# Patient Record
Sex: Female | Born: 1975 | Race: White | Hispanic: No | Smoking: Current every day smoker
Health system: Southern US, Community
[De-identification: ages and names within clinical notes are randomized; demographics above are authoritative.]

## PROBLEM LIST (undated history)

## (undated) DIAGNOSIS — R011 Cardiac murmur, unspecified: Secondary | ICD-10-CM

## (undated) DIAGNOSIS — F329 Major depressive disorder, single episode, unspecified: Secondary | ICD-10-CM

## (undated) DIAGNOSIS — F32A Depression, unspecified: Secondary | ICD-10-CM

## (undated) DIAGNOSIS — M1990 Unspecified osteoarthritis, unspecified site: Secondary | ICD-10-CM

## (undated) DIAGNOSIS — F410 Panic disorder [episodic paroxysmal anxiety] without agoraphobia: Secondary | ICD-10-CM

## (undated) DIAGNOSIS — G932 Benign intracranial hypertension: Secondary | ICD-10-CM

## (undated) HISTORY — PX: OOPHORECTOMY: SHX86

---

## 2005-07-22 ENCOUNTER — Ambulatory Visit: Payer: Self-pay | Admitting: Unknown Physician Specialty

## 2006-11-28 ENCOUNTER — Inpatient Hospital Stay (HOSPITAL_COMMUNITY): Admission: AD | Admit: 2006-11-28 | Discharge: 2006-11-28 | Payer: Self-pay | Admitting: Obstetrics and Gynecology

## 2007-04-13 ENCOUNTER — Ambulatory Visit: Payer: Self-pay | Admitting: Obstetrics & Gynecology

## 2007-04-14 ENCOUNTER — Inpatient Hospital Stay: Payer: Self-pay

## 2010-12-06 LAB — CBC
Hemoglobin: 11.9 — ABNORMAL LOW
MCHC: 35
MCV: 89.1
RDW: 14.1 — ABNORMAL HIGH
WBC: 14.9 — ABNORMAL HIGH

## 2011-08-21 ENCOUNTER — Emergency Department: Payer: Self-pay | Admitting: *Deleted

## 2011-08-31 ENCOUNTER — Emergency Department (HOSPITAL_COMMUNITY): Payer: Medicaid Other

## 2011-08-31 ENCOUNTER — Emergency Department: Payer: Self-pay | Admitting: Emergency Medicine

## 2011-08-31 ENCOUNTER — Encounter (HOSPITAL_COMMUNITY): Payer: Self-pay | Admitting: *Deleted

## 2011-08-31 ENCOUNTER — Emergency Department (HOSPITAL_COMMUNITY)
Admission: EM | Admit: 2011-08-31 | Discharge: 2011-09-01 | Disposition: A | Discharge status: Home / Self Care | Payer: Medicaid Other | Attending: Emergency Medicine | Admitting: Emergency Medicine

## 2011-08-31 DIAGNOSIS — S93409A Sprain of unspecified ligament of unspecified ankle, initial encounter: Secondary | ICD-10-CM | POA: Insufficient documentation

## 2011-08-31 DIAGNOSIS — W108XXA Fall (on) (from) other stairs and steps, initial encounter: Secondary | ICD-10-CM | POA: Insufficient documentation

## 2011-08-31 DIAGNOSIS — F17200 Nicotine dependence, unspecified, uncomplicated: Secondary | ICD-10-CM | POA: Insufficient documentation

## 2011-08-31 DIAGNOSIS — F3289 Other specified depressive episodes: Secondary | ICD-10-CM | POA: Insufficient documentation

## 2011-08-31 DIAGNOSIS — F329 Major depressive disorder, single episode, unspecified: Secondary | ICD-10-CM | POA: Insufficient documentation

## 2011-08-31 DIAGNOSIS — Y92009 Unspecified place in unspecified non-institutional (private) residence as the place of occurrence of the external cause: Secondary | ICD-10-CM | POA: Insufficient documentation

## 2011-08-31 HISTORY — DX: Panic disorder (episodic paroxysmal anxiety): F41.0

## 2011-08-31 HISTORY — DX: Depression, unspecified: F32.A

## 2011-08-31 HISTORY — DX: Major depressive disorder, single episode, unspecified: F32.9

## 2011-08-31 MED ORDER — OXYCODONE-ACETAMINOPHEN 5-325 MG PO TABS: 1.0000 | ORAL_TABLET | Freq: Four times a day (QID) | ORAL | Status: AC | PRN

## 2011-08-31 MED ORDER — OXYCODONE-ACETAMINOPHEN 5-325 MG PO TABS
2.0000 | ORAL_TABLET | Freq: Once | ORAL | Status: AC
  Administered 2011-08-31: 2 via ORAL
  Filled 2011-08-31: qty 2

## 2011-08-31 NOTE — ED Provider Notes (Signed)
History     CSN: [id number]  Arrival date & time 08/31/11  2119   First MD Initiated Contact with Patient 08/31/11 2334      Chief Complaint  Patient presents with  . Ankle Pain  . Foot Pain    (Consider location/radiation/quality/duration/timing/severity/associated sxs/prior treatment) HPI Comments: Patient states she was going down her porch steps, when she slipped, falling down one step, twisting her right ankle, 2 days ago.  She has been using an Ace wrap and taking ibuprofen, without any relief.  She states is still swelling, and discoloration, have improved, but she still having significant, pain with ambulation.  She has been using crutches as well has no previous history of injury to that ankle  Patient is a 36 y.o. female presenting with ankle pain and lower extremity pain. The history is provided by the patient.  Ankle Pain  The incident occurred 2 days ago. The incident occurred at home. The injury mechanism was a fall. The pain is present in the left ankle. Pertinent negatives include no numbness.  Foot Pain Associated symptoms include joint swelling. Pertinent negatives include no chills, numbness or weakness.    Past Medical History  Diagnosis Date  . Depression   . Anxiety attack     History reviewed. No pertinent past surgical history.  History reviewed. No pertinent family history.  History  Substance Use Topics  . Smoking status: Current Everyday Smoker  . Smokeless tobacco: Not on file  . Alcohol Use: Yes    OB History    Grav Para Term Preterm Abortions TAB SAB Ect Mult Living                  Review of Systems  Constitutional: Negative for chills.  Musculoskeletal: Positive for joint swelling. Negative for back pain.  Neurological: Negative for dizziness, weakness and numbness.    Allergies  Review of patient's allergies indicates no known allergies.  Home Medications   Current Outpatient Rx  Name Route Sig Dispense Refill  . FLUOXETINE  HCL 20 MG PO CAPS Oral Take 20 mg by mouth 2 (two) times daily.    . IBUPROFEN 200 MG PO TABS Oral Take 800 mg by mouth every 6 (six) hours as needed. For pain    . LORAZEPAM 1 MG PO TABS Oral Take 1 mg by mouth daily as needed. For anxiety    . OXYCODONE-ACETAMINOPHEN 5-325 MG PO TABS Oral Take 1 tablet by mouth every 6 (six) hours as needed for pain. 20 tablet 0    BP 129/71  Pulse 81  Temp 98.6 F (37 C) (Oral)  Resp 16  SpO2 99%  LMP 08/20/2011  Physical Exam  Constitutional: She appears well-developed and well-nourished.  HENT:  Head: Normocephalic.  Eyes: Pupils are equal, round, and reactive to light.  Neck: Normal range of motion.  Cardiovascular: Normal rate.   Pulmonary/Chest: She is in respiratory distress.  Musculoskeletal: She exhibits edema and tenderness.       Minimal amount of swelling to the right ankle joint.  No swelling in the foot, resolving bruising to the lateral malleolus area.  Full range of motion, strong distal pulses  Skin: Skin is warm. No erythema.    ED Course  Procedures (including critical care time)  Labs Reviewed - No data to display Dg Ankle Complete Right  08/31/2011  *RADIOLOGY REPORT*  Clinical Data: Pain and swelling at the lateral aspect of the right ankle and foot, status post fall.  RIGHT ANKLE -  COMPLETE 3+ VIEW  Comparison: None.  Findings: There is no evidence of fracture or dislocation.  The ankle mortise is incompletely assessed but appears grossly intact; the interosseous space is within normal limits.  No talar tilt or subluxation is seen.  Minimal irregularity at the dorsal aspect of the navicular is likely degenerative in nature.  The joint spaces are preserved.  No significant soft tissue abnormalities are seen.  IMPRESSION: No evidence of fracture or dislocation.  Original Report Authenticated By: Tonia Ghent, M.D.   Dg Foot Complete Right  08/31/2011  *RADIOLOGY REPORT*  Clinical Data: Pain and swelling at the lateral aspect  of the right ankle and right foot, status post fall.  RIGHT FOOT COMPLETE - 3+ VIEW  Comparison: None.  Findings: There is no evidence of fracture or dislocation.  The joint spaces are preserved.  There is no evidence of talar subluxation; the subtalar joint is unremarkable in appearance. Mild irregularity at the dorsal aspect of the navicular is likely degenerative in nature.  No significant soft tissue abnormalities are seen.  IMPRESSION: No evidence of fracture or dislocation.  Original Report Authenticated By: Tonia Ghent, M.D.     1. Ankle sprain       MDM   X-rays have been reviewed.  There is no fracture, dislocation, subluxation.  Patient will be placed in an ASO.  She, states she has crutches at, home.  She'll be given, additional pain control, and rehabilitation exercises, as well as a referral to orthopedic surgery        Arman Filter, NP 08/31/11 1610  Arman Filter, NP 08/31/11 [phone]

## 2011-08-31 NOTE — ED Notes (Signed)
Pt states 2 days ago she stepped off her porch and she twisted her ankle. Pt states blue and swollen for the past two days ibuprofen with some relief but still painful. Pt ambulatory but with pain.

## 2011-09-01 NOTE — ED Notes (Signed)
Pt discharge home with her friend.Pain has improved.Vital signs stable and GCS 15.

## 2011-09-05 NOTE — ED Provider Notes (Signed)
Medical screening examination/treatment/procedure(s) were performed by non-physician practitioner and as supervising physician I was immediately available for consultation/collaboration.  Cyndra Numbers, MD 09/05/11 2350

## 2012-01-15 ENCOUNTER — Emergency Department (HOSPITAL_COMMUNITY): Payer: Medicaid Other

## 2012-01-15 ENCOUNTER — Encounter (HOSPITAL_COMMUNITY): Payer: Self-pay | Admitting: Emergency Medicine

## 2012-01-15 ENCOUNTER — Emergency Department (HOSPITAL_COMMUNITY)
Admission: EM | Admit: 2012-01-15 | Discharge: 2012-01-15 | Disposition: A | Discharge status: Home / Self Care | Payer: Medicaid Other | Attending: Emergency Medicine | Admitting: Emergency Medicine

## 2012-01-15 DIAGNOSIS — F411 Generalized anxiety disorder: Secondary | ICD-10-CM | POA: Insufficient documentation

## 2012-01-15 DIAGNOSIS — Y9389 Activity, other specified: Secondary | ICD-10-CM | POA: Insufficient documentation

## 2012-01-15 DIAGNOSIS — F17200 Nicotine dependence, unspecified, uncomplicated: Secondary | ICD-10-CM | POA: Insufficient documentation

## 2012-01-15 DIAGNOSIS — Z79899 Other long term (current) drug therapy: Secondary | ICD-10-CM | POA: Insufficient documentation

## 2012-01-15 DIAGNOSIS — S93409A Sprain of unspecified ligament of unspecified ankle, initial encounter: Secondary | ICD-10-CM | POA: Insufficient documentation

## 2012-01-15 DIAGNOSIS — F3289 Other specified depressive episodes: Secondary | ICD-10-CM | POA: Insufficient documentation

## 2012-01-15 DIAGNOSIS — F329 Major depressive disorder, single episode, unspecified: Secondary | ICD-10-CM | POA: Insufficient documentation

## 2012-01-15 DIAGNOSIS — X500XXA Overexertion from strenuous movement or load, initial encounter: Secondary | ICD-10-CM | POA: Insufficient documentation

## 2012-01-15 DIAGNOSIS — Y9289 Other specified places as the place of occurrence of the external cause: Secondary | ICD-10-CM | POA: Insufficient documentation

## 2012-01-15 MED ORDER — OXYCODONE-ACETAMINOPHEN 5-325 MG PO TABS: 2.0000 | ORAL_TABLET | ORAL | Status: DC | PRN

## 2012-01-15 MED ORDER — OXYCODONE-ACETAMINOPHEN 5-325 MG PO TABS
1.0000 | ORAL_TABLET | Freq: Once | ORAL | Status: AC
  Administered 2012-01-15: 1 via ORAL
  Filled 2012-01-15: qty 1

## 2012-01-15 NOTE — ED Notes (Signed)
Pt c/o left ankle pain x 5 days after twisting ankle

## 2012-01-15 NOTE — ED Notes (Signed)
Pt states has been up all night with left ankle pain, no deformity noted, no swelling noted although pt states swollen, states was more swollen and bruised 5 days

## 2012-01-15 NOTE — ED Notes (Signed)
Pt returned from X-ray. Pt stable

## 2012-01-15 NOTE — ED Provider Notes (Signed)
History    This chart was scribed for Cheri Guppy, MD, MD by Smitty Pluck, ED Scribe. The patient was seen in room TR11C and the patient's care was started at 1:20PM.   CSN: [id number]  Arrival date & time 01/15/12  1253      Chief Complaint  Patient presents with  . Ankle Pain    (Consider location/radiation/quality/duration/timing/severity/associated sxs/prior treatment) Patient is a 36 y.o. female presenting with ankle pain. The history is provided by the patient. No language interpreter was used.  Ankle Pain    Michele Mann is a 36 y.o. female who presents to the Emergency Department complaining of constant, moderate left ankle pain onset 5 days ago. Pt reports that she twisted her left ankle while getting out of bed at night. She denies any other pain.      Past Medical History  Diagnosis Date  . Depression   . Anxiety attack     History reviewed. No pertinent past surgical history.  History reviewed. No pertinent family history.  History  Substance Use Topics  . Smoking status: Current Every Day Smoker  . Smokeless tobacco: Not on file  . Alcohol Use: Yes    OB History    Grav Para Term Preterm Abortions TAB SAB Ect Mult Living                  Review of Systems  Constitutional: Negative for fever and chills.  Respiratory: Negative for shortness of breath.   Gastrointestinal: Negative for nausea and vomiting.  Neurological: Negative for weakness.  All other systems reviewed and are negative.    Allergies  Review of patient's allergies indicates no known allergies.  Home Medications   Current Outpatient Rx  Name  Route  Sig  Dispense  Refill  . FLUOXETINE HCL 20 MG PO CAPS   Oral   Take 20 mg by mouth 2 (two) times daily.         . IBUPROFEN 200 MG PO TABS   Oral   Take 800 mg by mouth every 6 (six) hours as needed. For pain         . LORAZEPAM 1 MG PO TABS   Oral   Take 1 mg by mouth daily as needed. For anxiety             BP 116/69  Pulse 85  Temp 98 F (36.7 C) (Oral)  Resp 16  SpO2 98%  Physical Exam  Nursing note and vitals reviewed. Constitutional: She is oriented to person, place, and time. She appears well-developed and well-nourished. No distress.  HENT:  Head: Normocephalic and atraumatic.  Eyes: EOM are normal.  Neck: Neck supple. No tracheal deviation present.  Cardiovascular: Normal rate.   Pulmonary/Chest: Effort normal. No respiratory distress.  Musculoskeletal:       No left achilles tenderness No left medial malleolus  Tenderness of base of 5th metarsal of left foot Lateral mallelolus tenderness   No navicular tenderness  No swelling of left ankle No deformity of left foot or left ankle No color change to skin   Neurological: She is alert and oriented to person, place, and time.  Skin: Skin is warm and dry.  Psychiatric: She has a normal mood and affect. Her behavior is normal.    ED Course  Procedures (including critical care time) DIAGNOSTIC STUDIES: Oxygen Saturation is 98% on room air, normal by my interpretation.    COORDINATION OF CARE: 1:24 PM Discussed ED treatment with pt  1:25 PM Ordered:    . oxyCODONE-acetaminophen  1 tablet Oral Once       Labs Reviewed - No data to display Dg Ankle Complete Left  01/15/2012  *RADIOLOGY REPORT*  Clinical Data: Inversion injury.  LEFT ANKLE COMPLETE - 3+ VIEW  Comparison: None  Findings: Normal alignment and no fracture.  No joint effusion or arthropathy.  IMPRESSION: Negative   Original Report Authenticated By: Janeece Riggers, M.D.      No diagnosis found.    MDM  Ankle sprain. No fx or dislocation. Pt has crutches at  Home.      I personally performed the services described in this documentation, which was scribed in my presence. The recorded information has been reviewed and is accurate.    Cheri Guppy, MD 01/15/12 1401

## 2012-03-02 ENCOUNTER — Ambulatory Visit: Payer: Self-pay

## 2012-03-02 LAB — HEMATOCRIT: HCT: 36.6 % (ref 35.0–47.0)

## 2012-03-13 ENCOUNTER — Ambulatory Visit: Payer: Self-pay

## 2012-03-13 HISTORY — PX: ABDOMINAL HYSTERECTOMY: SHX81

## 2012-03-17 LAB — PATHOLOGY REPORT

## 2012-05-10 ENCOUNTER — Emergency Department (HOSPITAL_COMMUNITY): Payer: Medicaid Other

## 2012-05-10 ENCOUNTER — Encounter (HOSPITAL_COMMUNITY): Payer: Self-pay

## 2012-05-10 ENCOUNTER — Emergency Department (HOSPITAL_COMMUNITY)
Admission: EM | Admit: 2012-05-10 | Discharge: 2012-05-11 | Disposition: A | Discharge status: Home / Self Care | Payer: Medicaid Other | Attending: Emergency Medicine | Admitting: Emergency Medicine

## 2012-05-10 DIAGNOSIS — F3289 Other specified depressive episodes: Secondary | ICD-10-CM | POA: Insufficient documentation

## 2012-05-10 DIAGNOSIS — Z79899 Other long term (current) drug therapy: Secondary | ICD-10-CM | POA: Insufficient documentation

## 2012-05-10 DIAGNOSIS — F411 Generalized anxiety disorder: Secondary | ICD-10-CM | POA: Insufficient documentation

## 2012-05-10 DIAGNOSIS — S93402A Sprain of unspecified ligament of left ankle, initial encounter: Secondary | ICD-10-CM

## 2012-05-10 DIAGNOSIS — F17200 Nicotine dependence, unspecified, uncomplicated: Secondary | ICD-10-CM | POA: Insufficient documentation

## 2012-05-10 DIAGNOSIS — Y92009 Unspecified place in unspecified non-institutional (private) residence as the place of occurrence of the external cause: Secondary | ICD-10-CM | POA: Insufficient documentation

## 2012-05-10 DIAGNOSIS — F329 Major depressive disorder, single episode, unspecified: Secondary | ICD-10-CM | POA: Insufficient documentation

## 2012-05-10 DIAGNOSIS — S93409A Sprain of unspecified ligament of unspecified ankle, initial encounter: Secondary | ICD-10-CM | POA: Insufficient documentation

## 2012-05-10 DIAGNOSIS — X500XXA Overexertion from strenuous movement or load, initial encounter: Secondary | ICD-10-CM | POA: Insufficient documentation

## 2012-05-10 DIAGNOSIS — Y9301 Activity, walking, marching and hiking: Secondary | ICD-10-CM | POA: Insufficient documentation

## 2012-05-10 MED ORDER — HYDROCODONE-ACETAMINOPHEN 5-325 MG PO TABS: 1.0000 | ORAL_TABLET | Freq: Once | ORAL | Status: DC

## 2012-05-10 NOTE — Progress Notes (Signed)
Orthopedic Tech Progress Note Patient Details:  Michele Mann August 14, 1975 [id number]  Ortho Devices Type of Ortho Device: ASO   Haskell Flirt 05/10/2012, 11:47 PM

## 2012-05-10 NOTE — ED Notes (Signed)
Ortho called for brace 

## 2012-05-10 NOTE — ED Provider Notes (Signed)
History     CSN: [id number]  Arrival date & time 05/10/12  2209   First MD Initiated Contact with Patient 05/10/12 2250      Chief Complaint  Patient presents with  . Ankle Pain    (Consider location/radiation/quality/duration/timing/severity/associated sxs/prior treatment) HPI History provided by pt.   Pt states that she was walking across her yard to get to her car this evening, stepped in a "dip", and twisted her L ankle.  She fell, landing on her buttocks; did not hit her head.  C/o pain left lateral ankle only.  Aggravated by bearing weight but she is ambulatory.  Associated w/ paresthesias of 4th and 5th toes.  Has taken ibuprofen and tylenol for pain w/out relief.  Past Medical History  Diagnosis Date  . Depression   . Anxiety attack     Past Surgical History  Procedure Laterality Date  . Cesarean section      x2  . Abdominal hysterectomy  03/13/2012    No family history on file.  History  Substance Use Topics  . Smoking status: Current Every Day Smoker -- 1.00 packs/day    Types: Cigarettes  . Smokeless tobacco: Never Used  . Alcohol Use: Yes    OB History   Grav Para Term Preterm Abortions TAB SAB Ect Mult Living                  Review of Systems  All other systems reviewed and are negative.    Allergies  Review of patient's allergies indicates no known allergies.  Home Medications   Current Outpatient Rx  Name  Route  Sig  Dispense  Refill  . acetaminophen (TYLENOL) 500 MG tablet   Oral   Take 1,000 mg by mouth 3 (three) times daily as needed for pain.         Marland Kitchen ALPRAZolam (XANAX XR) 2 MG 24 hr tablet   Oral   Take 2 mg by mouth 3 (three) times daily as needed (for anxiety and panic disorder).         . diazepam (VALIUM) 5 MG tablet   Oral   Take 5 mg by mouth 3 (three) times daily as needed (for tendonitis in neck). 10 day trial started 05/07/2012         . fluvoxaMINE (LUVOX) 100 MG tablet   Oral   Take 100 mg by mouth at  bedtime.         Marland Kitchen ibuprofen (ADVIL,MOTRIN) 200 MG tablet   Oral   Take 600 mg by mouth once.           BP 109/65  Pulse 90  Temp(Src) 0 F (-17.8 C)  Resp 16  SpO2 100%  LMP 01/12/2012  Physical Exam  Nursing note and vitals reviewed. Constitutional: She is oriented to person, place, and time. She appears well-developed and well-nourished. No distress.  HENT:  Head: Normocephalic and atraumatic.  Eyes:  Normal appearance  Neck: Normal range of motion.  Pulmonary/Chest: Effort normal.  Musculoskeletal: Normal range of motion.  Left ankle w/out deformity or edema.  Mild ecchymosis on dorsal surface of lateral tarsals.  Tenderness at and inferior to lateral malleolus.  Pain w/ passive ROM.  Nml knee.  2+ DP pulse and distal sensation intact.    Neurological: She is alert and oriented to person, place, and time.  Psychiatric: She has a normal mood and affect. Her behavior is normal.    ED Course  Procedures (including critical care time)  Labs Reviewed - No data to display Dg Ankle Complete Left  05/10/2012  *RADIOLOGY REPORT*  Clinical Data: Diffuse left ankle pain after fall with twisting injury.  LEFT ANKLE COMPLETE - 3+ VIEW  Comparison: 01/15/2012  Findings: No evidence of acute fracture or subluxation.  Ankle mortis and talar dome appear intact.  No focal bone lesion or bone destruction.  There is evidence of degenerative change in the subtalar joints with prominent posterior process versus osteophyte of the talus.  Appearance is similar to previous study.  IMPRESSION: Degenerative changes in the left ankle.  No acute fracture demonstrated.   Original Report Authenticated By: Burman Nieves, M.D.      1. Sprain of left ankle, initial encounter       MDM  [phone] F presents w/ mechanical fall and inversion injury of left ankle.  Ambulatory.  No deformity and NV intact on exam.  Xray neg for fx/dislocation.  Suspect malingering.  Pt has been requesting pain medication  since arrival and she becomes argumentative when I deny her a prescription for narcotic.  She is here with a friend who also has a musculoskeletal complaint secondary to fall.  I recommended NSAID, rest and ice and ortho tech provided her with ASO.  She has crutches at home.  Had single dose of vicodin in ED.  Referred to ortho for persistent sx.          Otilio Miu, PA-C 05/10/12 2345

## 2012-05-10 NOTE — ED Notes (Signed)
Patient presents with left ankle pain. Reports that she has a "dip" in her yard [place] that is unleveled] that caused her to twist her ankle. Minimal swelling noted to left ankle area. Patient able to ambulate on it but with pain.

## 2012-05-11 NOTE — ED Provider Notes (Signed)
Medical screening examination/treatment/procedure(s) were performed by non-physician practitioner and as supervising physician I was immediately available for consultation/collaboration.  Hurman Horn, MD 05/11/12 [phone]

## 2012-07-06 ENCOUNTER — Emergency Department: Payer: Self-pay | Admitting: Emergency Medicine

## 2012-08-31 ENCOUNTER — Emergency Department (HOSPITAL_COMMUNITY): Payer: Medicaid Other

## 2012-08-31 ENCOUNTER — Emergency Department (HOSPITAL_COMMUNITY)
Admission: EM | Admit: 2012-08-31 | Discharge: 2012-08-31 | Disposition: A | Discharge status: Home / Self Care | Payer: Medicaid Other | Attending: Emergency Medicine | Admitting: Emergency Medicine

## 2012-08-31 ENCOUNTER — Encounter (HOSPITAL_COMMUNITY): Payer: Self-pay | Admitting: Adult Health

## 2012-08-31 DIAGNOSIS — Y939 Activity, unspecified: Secondary | ICD-10-CM | POA: Insufficient documentation

## 2012-08-31 DIAGNOSIS — R296 Repeated falls: Secondary | ICD-10-CM | POA: Insufficient documentation

## 2012-08-31 DIAGNOSIS — F17200 Nicotine dependence, unspecified, uncomplicated: Secondary | ICD-10-CM | POA: Insufficient documentation

## 2012-08-31 DIAGNOSIS — S93402A Sprain of unspecified ligament of left ankle, initial encounter: Secondary | ICD-10-CM

## 2012-08-31 DIAGNOSIS — F411 Generalized anxiety disorder: Secondary | ICD-10-CM | POA: Insufficient documentation

## 2012-08-31 DIAGNOSIS — Z79899 Other long term (current) drug therapy: Secondary | ICD-10-CM | POA: Insufficient documentation

## 2012-08-31 DIAGNOSIS — Y929 Unspecified place or not applicable: Secondary | ICD-10-CM | POA: Insufficient documentation

## 2012-08-31 DIAGNOSIS — F3289 Other specified depressive episodes: Secondary | ICD-10-CM | POA: Insufficient documentation

## 2012-08-31 DIAGNOSIS — S8990XA Unspecified injury of unspecified lower leg, initial encounter: Secondary | ICD-10-CM | POA: Insufficient documentation

## 2012-08-31 DIAGNOSIS — S93409A Sprain of unspecified ligament of unspecified ankle, initial encounter: Secondary | ICD-10-CM | POA: Insufficient documentation

## 2012-08-31 DIAGNOSIS — F329 Major depressive disorder, single episode, unspecified: Secondary | ICD-10-CM | POA: Insufficient documentation

## 2012-08-31 DIAGNOSIS — M25562 Pain in left knee: Secondary | ICD-10-CM

## 2012-08-31 DIAGNOSIS — Z791 Long term (current) use of non-steroidal anti-inflammatories (NSAID): Secondary | ICD-10-CM | POA: Insufficient documentation

## 2012-08-31 MED ORDER — IBUPROFEN 800 MG PO TABS: 800.0000 mg | ORAL_TABLET | Freq: Three times a day (TID) | ORAL | Status: AC

## 2012-08-31 MED ORDER — HYDROCODONE-ACETAMINOPHEN 5-325 MG PO TABS: 1.0000 | ORAL_TABLET | ORAL | Status: AC | PRN

## 2012-08-31 MED ORDER — IBUPROFEN 200 MG PO TABS
400.0000 mg | ORAL_TABLET | Freq: Once | ORAL | Status: AC
  Administered 2012-08-31: 400 mg via ORAL
  Filled 2012-08-31: qty 2

## 2012-08-31 NOTE — Progress Notes (Signed)
Orthopedic Tech Progress Note Patient Details:  Michele Mann 1975/07/04 [id number]  Ortho Devices Type of Ortho Device: Knee Sleeve;ASO Ortho Device/Splint Location: LLE Ortho Device/Splint Interventions: Ordered;Application   Jennye Moccasin 08/31/2012, 8:53 PM

## 2012-08-31 NOTE — ED Notes (Addendum)
Pt reports left knee injury in may and fall last night that twisted her ankle and landed on her left knee. She reports knee pain and left ankle pain. She is requesting an MRI or CT of her left knee.  Reports her left knee has been ongoing and hurting since the end of May

## 2012-08-31 NOTE — ED Notes (Signed)
Pt denies any questions upon discharge. 

## 2012-08-31 NOTE — ED Provider Notes (Signed)
History    This chart was scribed for Michele Munch, MD by Quintella Reichert, ED scribe.  This patient was seen in room TR05C/TR05C and the patient's care was started at 8:05 PM.  CSN: [id number]  Arrival date & time 08/31/12  1653     Chief Complaint  Patient presents with  . Knee Pain  . Ankle Pain    The history is provided by the patient. No language interpreter was used.    HPI Comments: Michele Mann is a 37 y.o. female who presents to the Emergency Department complaining of left knee and ankle pain.  Pt reports that she injured her knee 6 weeks ago and last night fell and twisted her ankle and landed on the knee again.  Her knee pain is the same in quality as it has been for the past 6 weeks but is more severe subsequent to the fall.  She has also experienced pain to the left ankle since the fall.  She notes that she can feel a knot to her knee and knee pain is exacerbated by lightly touching the area.  She describes ankle pain as burning and exacerbated by bearing weight.  Pt is ambulatory but with pain.  She denies chronic physical medical conditions and medicates regularly with Prozac and Xanax.     Past Medical History  Diagnosis Date  . Depression   . Anxiety attack     Past Surgical History  Procedure Laterality Date  . Cesarean section      x2  . Abdominal hysterectomy  03/13/2012    History reviewed. No pertinent family history.   History  Substance Use Topics  . Smoking status: Current Every Day Smoker -- 1.00 packs/day    Types: Cigarettes  . Smokeless tobacco: Never Used  . Alcohol Use: Yes    OB History   Grav Para Term Preterm Abortions TAB SAB Ect Mult Living                  Review of Systems  HENT: Negative for neck stiffness.   Musculoskeletal: Positive for joint swelling and arthralgias.  Skin: Negative for wound.  Neurological: Negative for numbness.  All other systems reviewed and are negative.    Allergies  Review of  patient's allergies indicates no known allergies.  Home Medications   Current Outpatient Rx  Name  Route  Sig  Dispense  Refill  . acetaminophen (TYLENOL) 500 MG tablet   Oral   Take 1,000 mg by mouth 3 (three) times daily as needed for pain.         Marland Kitchen ALPRAZolam (XANAX) 1 MG tablet   Oral   Take 1 mg by mouth 3 (three) times daily as needed for sleep or anxiety.         Marland Kitchen FLUoxetine (PROZAC) 40 MG capsule   Oral   Take 40 mg by mouth daily.         Marland Kitchen ibuprofen (ADVIL,MOTRIN) 200 MG tablet   Oral   Take 600 mg by mouth once.         Marland Kitchen HYDROcodone-acetaminophen (NORCO/VICODIN) 5-325 MG per tablet   Oral   Take 1 tablet by mouth every 4 (four) hours as needed for pain.   7 tablet   0   . ibuprofen (ADVIL,MOTRIN) 800 MG tablet   Oral   Take 1 tablet (800 mg total) by mouth 3 (three) times daily.   21 tablet   0    BP 142/79  Pulse  97  Temp(Src) 98.4 F (36.9 C) (Oral)  Resp 16  SpO2 97%  LMP 01/12/2012  Physical Exam  Nursing note and vitals reviewed. Constitutional: She is oriented to person, place, and time. She appears well-developed and well-nourished. No distress.  HENT:  Head: Normocephalic and atraumatic.  Eyes: Conjunctivae are normal. Pupils are equal, round, and reactive to light.  Neck: Normal range of motion.  Cardiovascular: Normal rate, regular rhythm, normal heart sounds and intact distal pulses.   No murmur heard. Capillary refill <3 seconds  Pulmonary/Chest: Effort normal and breath sounds normal. No respiratory distress. She has no wheezes.  Musculoskeletal: Normal range of motion. She exhibits tenderness. She exhibits no edema.       Left knee: She exhibits ecchymosis (mild). She exhibits normal range of motion, no swelling, no effusion, no deformity, no laceration and no erythema. Tenderness (patella) found.       Left ankle: She exhibits swelling and ecchymosis. She exhibits normal range of motion, no deformity and no laceration.  Tenderness. Lateral malleolus and medial malleolus tenderness found. Achilles tendon normal. Achilles tendon exhibits no pain, no defect and normal Thompson's test results.       Legs: Full ROM to toes, left ankle and left knee Tender to palpation on patella of left knee   Lymphadenopathy:    She has no cervical adenopathy.  Neurological: She is alert and oriented to person, place, and time. She exhibits normal muscle tone. Coordination normal.  Sensation intact Strength 5/5  Skin: Skin is warm and dry. She is not diaphoretic. No erythema.  No tenting of the skin  Psychiatric: She has a normal mood and affect. Her behavior is normal.    ED Course  Procedures (including critical care time)  DIAGNOSTIC STUDIES: Oxygen Saturation is 97% on room air, normal by my interpretation.    COORDINATION OF CARE: 8:13 PM- Discussed treatment plan which includes pain medication, knee sleeve, ankle brace and referral to orthopedist with pt at bedside and pt agreed to plan.     Labs Reviewed - No data to display Dg Ankle Complete Left  08/31/2012   *RADIOLOGY REPORT*  Clinical Data:  Fall.  Ankle injury and pain.  LEFT ANKLE COMPLETE - 3+ VIEW  Comparison:  None.  Findings:  There is no evidence of fracture, dislocation, or joint effusion.  There is no evidence of arthropathy or other focal bone abnormality involving the ankle joint.  Soft tissues are unremarkable.  Posterior subtalar joint osteoarthritis again noted.  IMPRESSION:  No acute findings.  Subtalar joint osteoarthritis again noted.   Original Report Authenticated By: Myles Rosenthal, M.D.   Dg Knee Complete 4 Views Left  08/31/2012   *RADIOLOGY REPORT*  Clinical Data: Fall.  Knee injury and pain.  LEFT KNEE - COMPLETE 4+ VIEW  Comparison:  None.  Findings:  There is no evidence of fracture, dislocation, or joint effusion.  There is no evidence of arthropathy or other focal bone abnormality.  Soft tissues are unremarkable.  IMPRESSION: Negative.    Original Report Authenticated By: Myles Rosenthal, M.D.   1. Knee joint pain, left   2. Sprain of left ankle, initial encounter     MDM  French Ana presents with knee and ankle pain after fall.  Patient X-Ray negative for obvious fracture or dislocation. I personally reviewed the imaging tests through PACS system.  I reviewed available ER/hospitalization records through the EMR.  Pain managed in ED. Pt advised to follow up with orthopedics if symptoms  persist for possibility of missed fracture diagnosis. Patient given knee and ankle brace while in ED, conservative therapy recommended and discussed. Patient will be dc home & is agreeable with above plan.  I have also discussed reasons to return immediately to the ER.  Patient expresses understanding and agrees with plan.  I personally performed the services described in this documentation, which was scribed in my presence. The recorded information has been reviewed and is accurate.    Dahlia Client Olanda Boughner, PA-C 08/31/12 2046

## 2012-09-01 NOTE — ED Provider Notes (Signed)
  Medical screening examination/treatment/procedure(s) were performed by non-physician practitioner and as supervising physician I was immediately available for consultation/collaboration.    Giani Winther, MD 09/01/12 0034 

## 2014-06-17 NOTE — Op Note (Signed)
PATIENT NAME:  Michele Mann, Michele Mann MR#:  [id number] DATE OF BIRTH:  01-28-1976  DATE OF PROCEDURE:  03/13/2012  PREOPERATIVE DIAGNOSIS:  Menorrhagia, left lower quadrant pain.   POSTOPERATIVE DIAGNOSIS:  Menorrhagia, left lower quadrant pain, leiomyoma.   OPERATION PERFORMED:  Laparoscopic supracervical hysterectomy, left salpingo-oophorectomy.   SURGEON:  Deloris Pinghilip J. Luella Cookosenow, M.D.   OPERATIVE FINDINGS:  Surgery was quite difficult secondary to patient's obesity and adhesions from her 2 previous cesarean sections.   DESCRIPTION OF PROCEDURE:  After adequate general anesthesia, the patient was prepped and draped in routine fashion. An infraumbilical incision was made through the skin and approximately 3 liters of carbon dioxide were insufflated without incident.  During insufflation, the uterine manipulator was placed and bladder was drained.  The laparoscope is inserted.  It should be noted throughout that there was moderate difficulty secondary to patient's obesity.  After visualization of the pelvis, accessory ports were placed in the left and right lower quadrant.  The left fundus of the uterus was grasped with a tenaculum.  The  tube, broad ligament and round ligament were serially clamped, divided with the harmonic scalpel.  It should be noted that the left tube and ovary were left in situ for the present.  A bladder flap was created and bladder was pushed down.  The left uterine vessels were then cauterized with a bipolar cautery.  Like procedure was carried out on the other side.  Ultimately, the specimen was amputated.  Our attention was then placed to the left tube and ovary which was densely plastered to the pelvic sidewall.  By careful and blunt dissection this was dissected free, care being taken to avoid the ureter.  This was serially clamped, cauterized and/or divided with the Harmonic scalpel ultimately freeing the left tube and ovary.  Specimens were morcellated.  The pelvis lavaged with  copious amounts of saline and all areas of surgery inspected and found hemostatic.  Intercede was placed.  The site of the morcellator was closed with interrupted Vicryl.  The CO2 was allowed to escape.  The remainder of the skin incision was closed in routine fashion.  Estimated blood loss 50 mL.  The patient tolerated the procedure well, left the operating room in good condition.  Sponge and needle counts were said to be correct at the end of the procedure.     ____________________________ Deloris PingPhilip J. Luella Cookosenow, MD pjr:ea D: 03/13/2012 13:20:13 ET T: 03/14/2012 06:13:23 ET JOB#: [id number]  cc: Deloris PingPhilip J. Luella Cookosenow, MD, <Dictator> Towana BadgerPHILIP J ROSENOW MD ELECTRONICALLY SIGNED 04/01/2012 8:11

## 2014-07-21 ENCOUNTER — Emergency Department
Admission: EM | Admit: 2014-07-21 | Discharge: 2014-07-21 | Disposition: A | Discharge status: Home / Self Care | Payer: Medicaid Other | Attending: Emergency Medicine | Admitting: Emergency Medicine

## 2014-07-21 DIAGNOSIS — Z79899 Other long term (current) drug therapy: Secondary | ICD-10-CM | POA: Insufficient documentation

## 2014-07-21 DIAGNOSIS — R350 Frequency of micturition: Secondary | ICD-10-CM | POA: Diagnosis present

## 2014-07-21 DIAGNOSIS — Z720 Tobacco use: Secondary | ICD-10-CM | POA: Diagnosis not present

## 2014-07-21 DIAGNOSIS — N3000 Acute cystitis without hematuria: Secondary | ICD-10-CM

## 2014-07-21 DIAGNOSIS — Z90710 Acquired absence of both cervix and uterus: Secondary | ICD-10-CM | POA: Insufficient documentation

## 2014-07-21 LAB — URINALYSIS COMPLETE WITH MICROSCOPIC (ARMC ONLY)
Bilirubin Urine: NEGATIVE
GLUCOSE, UA: NEGATIVE mg/dL
Hgb urine dipstick: NEGATIVE
NITRITE: NEGATIVE
PH: 5 (ref 5.0–8.0)
PROTEIN: NEGATIVE mg/dL
Specific Gravity, Urine: 1.028 (ref 1.005–1.030)
TRANS EPITHEL UA: 2

## 2014-07-21 MED ORDER — KETOROLAC TROMETHAMINE 10 MG PO TABS: 10.0000 mg | ORAL_TABLET | Freq: Once | ORAL | Status: AC

## 2014-07-21 MED ORDER — SULFAMETHOXAZOLE-TRIMETHOPRIM 800-160 MG PO TABS: 1.0000 | ORAL_TABLET | Freq: Two times a day (BID) | ORAL | Status: AC

## 2014-07-21 MED ORDER — KETOROLAC TROMETHAMINE 10 MG PO TABS: 10.0000 mg | ORAL_TABLET | Freq: Four times a day (QID) | ORAL | Status: AC | PRN

## 2014-07-21 MED ORDER — SULFAMETHOXAZOLE-TRIMETHOPRIM 800-160 MG PO TABS: 1.0000 | ORAL_TABLET | Freq: Two times a day (BID) | ORAL | Status: DC

## 2014-07-21 MED ORDER — PHENAZOPYRIDINE HCL 200 MG PO TABS
ORAL_TABLET | ORAL | Status: AC
  Administered 2014-07-21: 200 mg via ORAL
  Filled 2014-07-21: qty 1

## 2014-07-21 MED ORDER — PHENAZOPYRIDINE HCL 200 MG PO TABS: 200.0000 mg | ORAL_TABLET | Freq: Once | ORAL | Status: AC

## 2014-07-21 MED ORDER — SULFAMETHOXAZOLE-TRIMETHOPRIM 800-160 MG PO TABS
ORAL_TABLET | ORAL | Status: AC
  Administered 2014-07-21: 1 via ORAL
  Filled 2014-07-21: qty 1

## 2014-07-21 MED ORDER — KETOROLAC TROMETHAMINE 10 MG PO TABS
ORAL_TABLET | ORAL | Status: AC
  Administered 2014-07-21: 10 mg via ORAL
  Filled 2014-07-21: qty 1

## 2014-07-21 MED ORDER — PHENAZOPYRIDINE HCL 200 MG PO TABS: 200.0000 mg | ORAL_TABLET | Freq: Three times a day (TID) | ORAL | Status: AC | PRN

## 2014-07-21 NOTE — Discharge Instructions (Signed)

## 2014-07-21 NOTE — ED Provider Notes (Signed)
CSN: [id number]     Arrival date & time 07/21/14  1654 History   First MD Initiated Contact with Patient 07/21/14 1705     Chief Complaint  Patient presents with  . Urinary Frequency     (Consider location/radiation/quality/duration/timing/severity/associated sxs/prior Treatment) HPI The patient with a 2 day history of urinary frequency and burning urgency denies nausea vomiting fever chills says it's a burning discomfort about a 5 out of 10 at its worst nothing seemingly making it better or worse and otherwise has no complaints or symptoms at this time is here for evaluation of urinary tract infection denies vaginal bleeding vaginal discharge patient has had a hysterectomy in the past Past Medical History  Diagnosis Date  . Depression   . Anxiety attack    Past Surgical History  Procedure Laterality Date  . Cesarean section      x2  . Abdominal hysterectomy  03/13/2012   No family history on file. History  Substance Use Topics  . Smoking status: Current Every Day Smoker -- 1.00 packs/day    Types: Cigarettes  . Smokeless tobacco: Never Used  . Alcohol Use: Yes   OB History    No data available     Review of Systems  Constitutional: Negative.   HENT: Negative.   Eyes: Negative.   Respiratory: Negative.   Cardiovascular: Negative.   Musculoskeletal: Negative.   Skin: Negative.   All other systems reviewed and are negative.      Allergies  Review of patient's allergies indicates no known allergies.  Home Medications   Prior to Admission medications   Medication Sig Start Date End Date Taking? Authorizing Provider  acetaminophen (TYLENOL) 500 MG tablet Take 1,000 mg by mouth 3 (three) times daily as needed for pain.    Historical Provider, MD  ALPRAZolam Prudy Feeler(XANAX) 1 MG tablet Take 1 mg by mouth 3 (three) times daily as needed for sleep or anxiety.    Historical Provider, MD  FLUoxetine (PROZAC) 40 MG capsule Take 40 mg by mouth daily.    Historical Provider, MD   HYDROcodone-acetaminophen (NORCO/VICODIN) 5-325 MG per tablet Take 1 tablet by mouth every 4 (four) hours as needed for pain. 08/31/12   Hannah Muthersbaugh, PA-C  ibuprofen (ADVIL,MOTRIN) 200 MG tablet Take 600 mg by mouth once.    Historical Provider, MD  ibuprofen (ADVIL,MOTRIN) 800 MG tablet Take 1 tablet (800 mg total) by mouth 3 (three) times daily. 08/31/12   Hannah Muthersbaugh, PA-C  ketorolac (TORADOL) 10 MG tablet Take 1 tablet (10 mg total) by mouth every 6 (six) hours as needed. 07/21/14   Anaiz Qazi Kristine GarbeWilliam C Azlynn Mitnick, PA-C  phenazopyridine (PYRIDIUM) 200 MG tablet Take 1 tablet (200 mg total) by mouth 3 (three) times daily as needed for pain. 07/21/14 07/21/15  Jaydenn Boccio William C Khilee Hendricksen, PA-C  sulfamethoxazole-trimethoprim (BACTRIM DS,SEPTRA DS) 800-160 MG per tablet Take 1 tablet by mouth 2 (two) times daily. 07/21/14   Jehieli Brassell William C Lanessa Shill, PA-C   BP 117/73 mmHg  Pulse 109  Temp(Src) 98.3 F (36.8 C) (Oral)  Resp 18  Ht 5\' 3"  (1.6 m)  Wt 200 lb (90.719 kg)  BMI 35.44 kg/m2  SpO2 95%  LMP 01/12/2012 Physical Exam Female appearing stated age well-developed well-nourished no acute distress Vitals reviewed Head ears eyes nose neck and throat examination this patient unremarkable Cardiovascular regular rate and rhythm no murmurs rubs gallops Pulmonary lungs clear to auscultation bilaterally Abdomen soft flat nontender bowel sounds positive in all 4 quadrants no CVA tenderness  Skin free of rash or disease GU exam deferred Neuro exam nonfocal cranial nerves II through XII grossly intact Musculoskeletal exam patient is moving all extremities without difficulty ED Course  Procedures (including critical care time) Labs Review Labs Reviewed  URINALYSIS COMPLETEWITH MICROSCOPIC ([facility] ONLY) - Abnormal; Notable for the following:    Color, Urine YELLOW (*)    APPearance HAZY (*)    Ketones, ur TRACE (*)    Leukocytes, UA 1+ (*)    Bacteria, UA RARE (*)    Squamous Epithelial / LPF 6-30 (*)     All other components within normal limits      MDM  Patient with dysuria and white blood cells trace bacteria leukocytes in her urine with a history of frequent urinary tract infections start the patient on Bactrim Pyridium and Toradol is to follow-up with her doctor if she has continued symptoms following anabiotic therapy return here for any acute concerns or worsening symptoms  Final diagnoses:  Acute cystitis without hematuria       Octavion Mollenkopf Rosalyn Gess, PA-C 07/21/14 1902  Phineas Semen, MD 07/21/14 2022

## 2014-07-21 NOTE — ED Notes (Signed)
Patient to ED with c/o probable UTI.

## 2015-05-05 ENCOUNTER — Other Ambulatory Visit: Payer: Self-pay | Admitting: Family Medicine

## 2015-05-05 DIAGNOSIS — I38 Endocarditis, valve unspecified: Secondary | ICD-10-CM

## 2015-05-05 DIAGNOSIS — R7982 Elevated C-reactive protein (CRP): Secondary | ICD-10-CM

## 2015-05-08 ENCOUNTER — Encounter (INDEPENDENT_AMBULATORY_CARE_PROVIDER_SITE_OTHER): Payer: Self-pay

## 2015-05-08 ENCOUNTER — Other Ambulatory Visit: Payer: Self-pay

## 2015-05-08 ENCOUNTER — Ambulatory Visit (INDEPENDENT_AMBULATORY_CARE_PROVIDER_SITE_OTHER): Payer: Medicare Other

## 2015-05-08 DIAGNOSIS — R7982 Elevated C-reactive protein (CRP): Secondary | ICD-10-CM | POA: Diagnosis not present

## 2015-05-08 DIAGNOSIS — I38 Endocarditis, valve unspecified: Secondary | ICD-10-CM | POA: Diagnosis not present

## 2015-05-09 LAB — ECHOCARDIOGRAM COMPLETE
AORTIC ROOT 2D: 30 mm
CHL CUP DOP CALC LVOT VTI: 21 cm
CHL CUP LA SIZE INDEX: 1.51 mm/m2
CHL CUP LA VOL 2D: 45 mL
CHL CUP LVOT MEAN VEL: 62.3 cm/s
CHL CUP LVOT SV INDEX: 32 mL/m2
E/e' ratio: 6.89
EWDT: 246 ms
FS: 43 % (ref 28–44)
IV/PV OW: 0.97
LA ID, A-P, ES: 31 mm
LA VOL 2D INDEX: 22 mL/m2
LA vol: 52.2 mL
LAVOLIN: 25.5 mL/m2
LDCA: 3.14 cm2
LEFT ATRIUM END SYS DIAM: 31 mm
LV PW s: 6.29 mm
LV TDI E'MEDIAL: 10.6 cm/s
LVIDD: 48.2 mm — AB (ref 3.5–6.0)
LVIDS: 27.6 mm — AB (ref 2.1–4.0)
LVOT SV: 66 mL
LVOT peak vel: 82.5 cm/s
LVOTD: 20 mm
MV Dec: 246 ms
MV pk E vel: 95.1 cm/s
MVPG: 4 mmHg
MVPKAVEL: 83.9 cm/s
PW: 6.29 mm — AB (ref 0.6–1.1)
RV TAPSE: 21.6 mm
TDI e' lateral: 13.8 cm/s

## 2020-02-01 ENCOUNTER — Other Ambulatory Visit: Payer: Self-pay | Admitting: Ophthalmology

## 2020-02-01 DIAGNOSIS — H53123 Transient visual loss, bilateral: Secondary | ICD-10-CM

## 2020-02-01 DIAGNOSIS — H4710 Unspecified papilledema: Secondary | ICD-10-CM

## 2020-02-09 ENCOUNTER — Ambulatory Visit
Admission: RE | Admit: 2020-02-09 | Discharge: 2020-02-09 | Disposition: A | Discharge status: Home / Self Care | Payer: Medicare Other | Source: Ambulatory Visit | Attending: Ophthalmology | Admitting: Ophthalmology

## 2020-02-09 ENCOUNTER — Other Ambulatory Visit: Payer: Self-pay

## 2020-02-09 DIAGNOSIS — H53123 Transient visual loss, bilateral: Secondary | ICD-10-CM | POA: Diagnosis present

## 2020-02-09 DIAGNOSIS — H4710 Unspecified papilledema: Secondary | ICD-10-CM | POA: Diagnosis present

## 2020-02-09 MED ORDER — GADOBUTROL 1 MMOL/ML IV SOLN
10.0000 mL | Freq: Once | INTRAVENOUS | Status: AC | PRN
  Administered 2020-02-09: 10 mL via INTRAVENOUS

## 2020-02-16 ENCOUNTER — Ambulatory Visit
Admission: RE | Admit: 2020-02-16 | Discharge: 2020-02-16 | Disposition: A | Discharge status: Home / Self Care | Payer: Medicare Other | Source: Ambulatory Visit | Attending: Neurology | Admitting: Neurology

## 2020-02-16 ENCOUNTER — Other Ambulatory Visit: Payer: Self-pay | Admitting: Neurology

## 2020-02-16 ENCOUNTER — Other Ambulatory Visit: Payer: Self-pay

## 2020-02-16 ENCOUNTER — Other Ambulatory Visit (HOSPITAL_COMMUNITY): Payer: Self-pay | Admitting: Neurology

## 2020-02-16 DIAGNOSIS — H539 Unspecified visual disturbance: Secondary | ICD-10-CM

## 2020-02-16 DIAGNOSIS — G932 Benign intracranial hypertension: Secondary | ICD-10-CM | POA: Diagnosis present

## 2020-02-16 DIAGNOSIS — H93A3 Pulsatile tinnitus, bilateral: Secondary | ICD-10-CM | POA: Diagnosis present

## 2020-02-16 MED ORDER — GADOBUTROL 1 MMOL/ML IV SOLN
9.0000 mL | Freq: Once | INTRAVENOUS | Status: AC | PRN
  Administered 2020-02-16: 9 mL via INTRAVENOUS

## 2020-02-23 ENCOUNTER — Other Ambulatory Visit: Payer: Self-pay | Admitting: Neurology

## 2020-02-23 DIAGNOSIS — G932 Benign intracranial hypertension: Secondary | ICD-10-CM

## 2020-03-06 ENCOUNTER — Ambulatory Visit
Admission: RE | Admit: 2020-03-06 | Discharge: 2020-03-06 | Disposition: A | Discharge status: Home / Self Care | Payer: Medicare Other | Source: Ambulatory Visit | Attending: Neurology | Admitting: Neurology

## 2020-03-06 ENCOUNTER — Other Ambulatory Visit: Payer: Self-pay

## 2020-03-06 DIAGNOSIS — G932 Benign intracranial hypertension: Secondary | ICD-10-CM | POA: Insufficient documentation

## 2020-03-06 LAB — CBC WITH DIFFERENTIAL/PLATELET
Abs Immature Granulocytes: 0.02 10*3/uL (ref 0.00–0.07)
Basophils Absolute: 0 10*3/uL (ref 0.0–0.1)
Basophils Relative: 1 %
Eosinophils Absolute: 0.2 10*3/uL (ref 0.0–0.5)
Eosinophils Relative: 3 %
HCT: 41.1 % (ref 36.0–46.0)
Hemoglobin: 13.6 g/dL (ref 12.0–15.0)
Immature Granulocytes: 0 %
Lymphocytes Relative: 42 %
Lymphs Abs: 3.2 10*3/uL (ref 0.7–4.0)
MCH: 29.6 pg (ref 26.0–34.0)
MCHC: 33.1 g/dL (ref 30.0–36.0)
MCV: 89.3 fL (ref 80.0–100.0)
Monocytes Absolute: 0.4 10*3/uL (ref 0.1–1.0)
Monocytes Relative: 6 %
Neutro Abs: 3.7 10*3/uL (ref 1.7–7.7)
Neutrophils Relative %: 48 %
Platelets: 187 10*3/uL (ref 150–400)
RBC: 4.6 MIL/uL (ref 3.87–5.11)
RDW: 14.2 % (ref 11.5–15.5)
WBC: 7.6 10*3/uL (ref 4.0–10.5)
nRBC: 0 % (ref 0.0–0.2)

## 2020-03-06 LAB — PROTIME-INR
INR: 1 (ref 0.8–1.2)
Prothrombin Time: 12.3 seconds (ref 11.4–15.2)

## 2020-03-06 LAB — APTT: aPTT: 32 seconds (ref 24–36)

## 2020-03-06 MED ORDER — LIDOCAINE HCL (PF) 1 % IJ SOLN
5.0000 mL | Freq: Once | INTRAMUSCULAR | Status: AC
  Administered 2020-03-06: 5 mL
  Filled 2020-03-06: qty 5

## 2020-03-06 MED ORDER — ACETAMINOPHEN 500 MG PO TABS: 1000.0000 mg | ORAL_TABLET | Freq: Four times a day (QID) | ORAL | Status: DC | PRN

## 2020-03-06 NOTE — Discharge Instructions (Signed)
Lumbar Puncture, Care After This sheet gives you information about how to care for yourself after your procedure. Your health care provider may also give you more specific instructions. If you have problems or questions, contact your health care provider. What can I expect after the procedure? After the procedure, it is common to have:  Mild discomfort or pain at the puncture site.  A mild headache that is relieved with pain medicines. Follow these instructions at home: Activity  Lie down flat or rest for as long as directed by your health care provider.  Return to your normal activities as told by your health care provider. Ask your health care provider what activities are safe for you.  Avoid lifting anything heavier than 10 lb (4.5 kg) for at least 12 hours after the procedure.  Do not drive for 24 hours if you were given a medicine to help you relax (sedative) during your procedure.  Do not drive or use heavy machinery while taking prescription pain medicine.   Puncture site care  Remove or change your bandage (dressing) as told by your health care provider.  Check your puncture area every day for signs of infection. Check for: ? More pain. ? Redness or swelling. ? Fluid or blood leaking from the puncture site. ? Warmth. ? Pus or a bad smell. General instructions  Take over-the-counter and prescription medicines only as told by your health care provider.  Drink enough fluids to keep your urine clear or pale yellow. Your health care provider may recommend drinking caffeine to prevent a headache.  Keep all follow-up visits as told by your health care provider. This is important. Contact a health care provider if:  You have fever or chills.  You have nausea or vomiting.  You have a headache that lasts for more than 2 days or does not get better with medicine. Get help right away if:  You develop any of the following in your  legs: ? Weakness. ? Numbness. ? Tingling.  You are unable to control when you urinate or have a bowel movement (incontinence).  You have signs of infection around your puncture site, such as: ? More pain. ? Redness or swelling. ? Fluid or blood leakage. ? Warmth. ? Pus or a bad smell.  You are dizzy or you feel like you might faint.  You have a severe headache, especially when you sit or stand. Summary  A lumbar puncture is a procedure in which a small needle is inserted into the lower back to remove fluid that surrounds the brain and spinal cord.  After this procedure, it is common to have a headache and pain around the needle insertion area.  Lying flat, staying hydrated, and drinking caffeine can help prevent headaches.  Monitor your needle insertion site for signs of infection, including warmth, fluid, or more pain.  Get help right away if you develop leg weakness, leg numbness, incontinence, or severe headaches. This information is not intended to replace advice given to you by your health care provider. Make sure you discuss any questions you have with your health care provider. Document Revised: 12/23/2019 Document Reviewed: 12/23/2019 Elsevier Patient Education  2021 [employer]  

## 2020-07-31 ENCOUNTER — Other Ambulatory Visit
Admission: RE | Admit: 2020-07-31 | Discharge: 2020-07-31 | Disposition: A | Discharge status: Home / Self Care | Payer: Medicare Other | Source: Ambulatory Visit | Attending: Student | Admitting: Student

## 2020-07-31 DIAGNOSIS — R0602 Shortness of breath: Secondary | ICD-10-CM | POA: Insufficient documentation

## 2020-07-31 DIAGNOSIS — R002 Palpitations: Secondary | ICD-10-CM | POA: Diagnosis present

## 2020-07-31 LAB — BRAIN NATRIURETIC PEPTIDE: B Natriuretic Peptide: 16.8 pg/mL (ref 0.0–100.0)

## 2020-10-05 ENCOUNTER — Other Ambulatory Visit
Admission: RE | Admit: 2020-10-05 | Discharge: 2020-10-05 | Disposition: A | Discharge status: Home / Self Care | Payer: Medicare Other | Source: Ambulatory Visit | Attending: Pulmonary Disease | Admitting: Pulmonary Disease

## 2020-10-05 DIAGNOSIS — J4540 Moderate persistent asthma, uncomplicated: Secondary | ICD-10-CM | POA: Diagnosis present

## 2020-10-05 LAB — D-DIMER, QUANTITATIVE: D-Dimer, Quant: 1.15 ug/mL-FEU — ABNORMAL HIGH (ref 0.00–0.50)

## 2020-10-09 ENCOUNTER — Other Ambulatory Visit: Payer: Self-pay | Admitting: Pulmonary Disease

## 2020-10-09 ENCOUNTER — Ambulatory Visit
Admission: RE | Admit: 2020-10-09 | Discharge: 2020-10-09 | Disposition: A | Discharge status: Home / Self Care | Payer: Medicare Other | Source: Ambulatory Visit | Attending: Pulmonary Disease | Admitting: Pulmonary Disease

## 2020-10-09 ENCOUNTER — Other Ambulatory Visit: Payer: Self-pay

## 2020-10-09 DIAGNOSIS — R7989 Other specified abnormal findings of blood chemistry: Secondary | ICD-10-CM | POA: Diagnosis present

## 2020-10-09 MED ORDER — IOHEXOL 350 MG/ML SOLN
75.0000 mL | Freq: Once | INTRAVENOUS | Status: AC | PRN
  Administered 2020-10-09: 75 mL via INTRAVENOUS

## 2020-10-16 ENCOUNTER — Other Ambulatory Visit: Admission: RE | Admit: 2020-10-16 | Payer: Medicare Other | Source: Ambulatory Visit

## 2020-10-17 ENCOUNTER — Ambulatory Visit: Payer: Medicare Other | Attending: Neurology

## 2020-10-17 DIAGNOSIS — R0683 Snoring: Secondary | ICD-10-CM | POA: Insufficient documentation

## 2020-10-18 ENCOUNTER — Other Ambulatory Visit: Payer: Self-pay

## 2021-05-12 IMAGING — RF DG FLUORO GUIDE SPINAL/SI JT INJ*L*
1 series · 1 of 1 positions shown · non-contrast
Comparison: none

CLINICAL DATA: Idiopathic intracranial hypertension

[Series 1: cp_standard · 0.26mm/px · 1 of 1 slices shown]
[im 1/1]
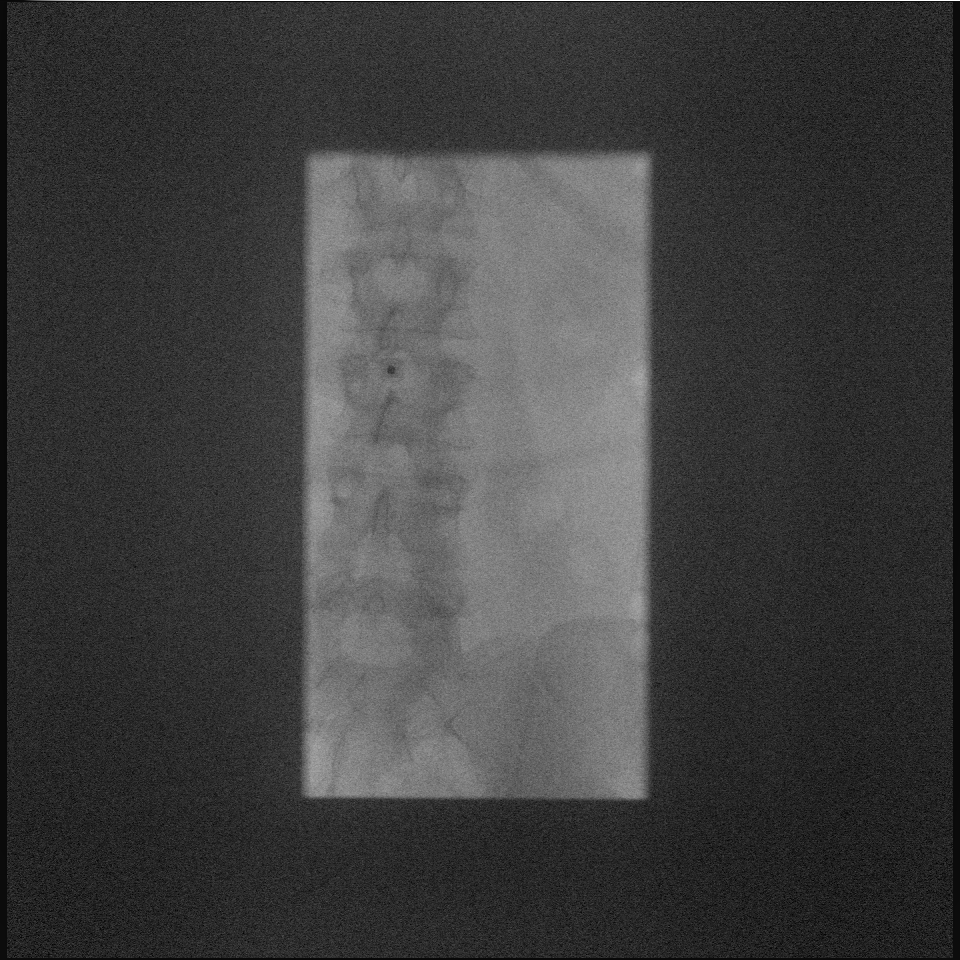

[1 of 1 positions shown; findings below may reference images not displayed]

EXAM:
DIAGNOSTIC LUMBAR PUNCTURE UNDER FLUOROSCOPIC GUIDANCE

FLUOROSCOPY TIME:  Fluoroscopy Time:  0.5 minute

Radiation Exposure Index (if provided by the fluoroscopic device):
4.1 mGy

Number of Acquired Spot Images: 0

PROCEDURE:
Informed consent was obtained from the patient prior to the
procedure, including potential complications of headache, allergy,
and pain. With the patient prone, the lower back was prepped with
Betadine. 1% Lidocaine was used for local anesthesia. Lumbar
puncture was performed at the L2-3 level using a 22 gauge needle
with return of clear CSF with an opening pressure of 36 cm water.
CSF was evacuated with a closing pressure of 13 cm water. The
patient tolerated the procedure well and there were no apparent
complications.
IMPRESSION: Successful fluoroscopic guided lumbar puncture.

## 2021-12-15 IMAGING — CT CT ANGIO CHEST
2 of 6 series · 18 of 46 positions shown · IV contrast (APPLIED)
Comparison: None.

CLINICAL DATA: Positive D-dimer, history of COVID 6762

EXAM:
CT ANGIOGRAPHY CHEST WITH CONTRAST
TECHNIQUE: Multidetector CT imaging of the chest was performed using the
standard protocol during bolus administration of intravenous
contrast. Multiplanar CT image reconstructions and MIPs were
obtained to evaluate the vascular anatomy.
CONTRAST:  75mL OMNIPAQUE IOHEXOL 350 MG/ML SOLN

[Series 6: thins · axial · 0.70mm/px · z∈[-620,-372]mm · 15 of 340 slices shown]
[im 15/340  lung]
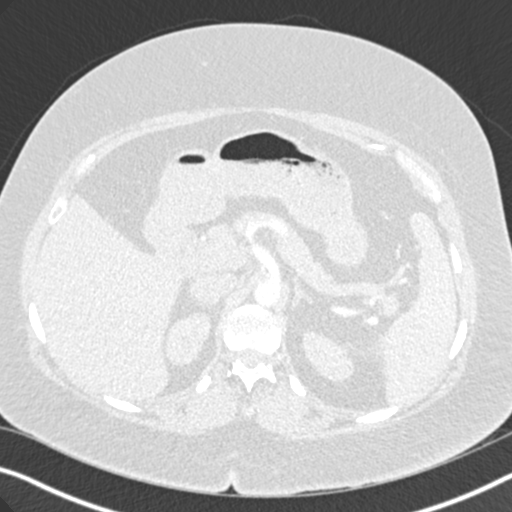
[im 43/340  soft-tissue]
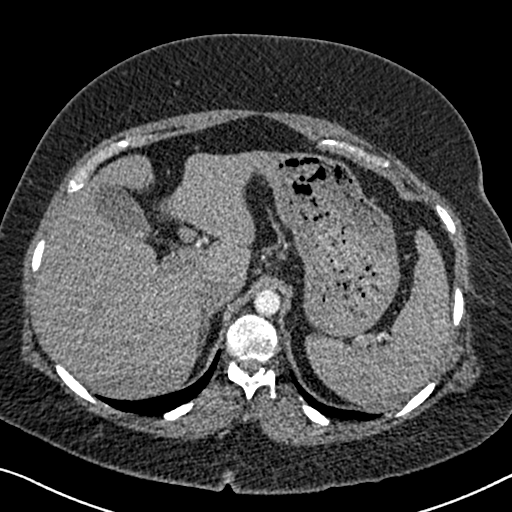
[im 57/340  lung]
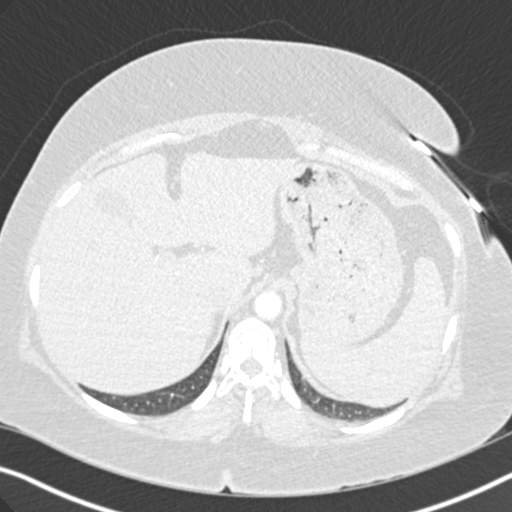
[im 85/340  soft-tissue]
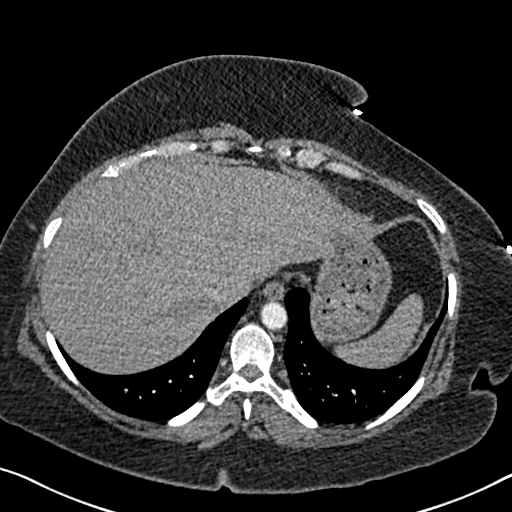
[im 99/340  lung]
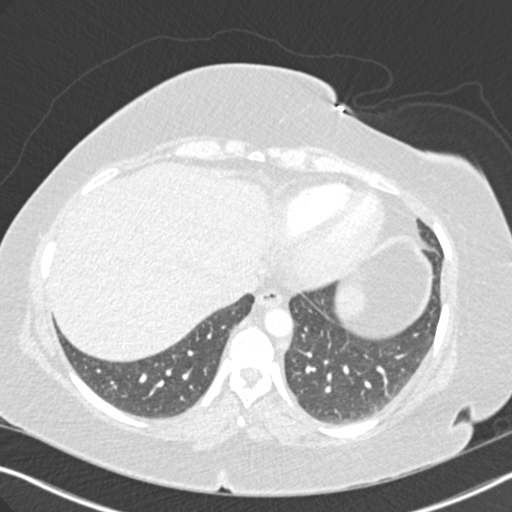
[im 128/340  soft-tissue]
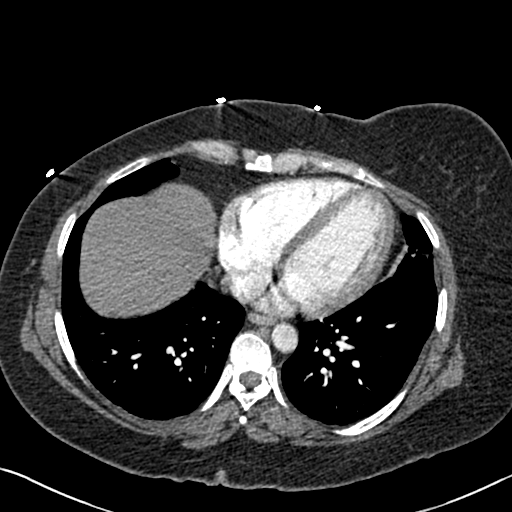
[im 142/340  lung]
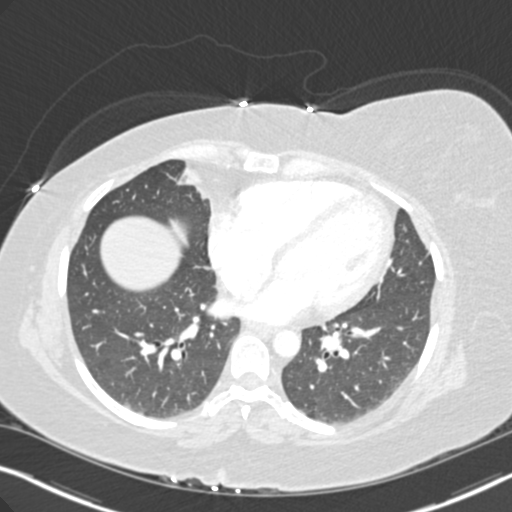
[im 170/340  soft-tissue]
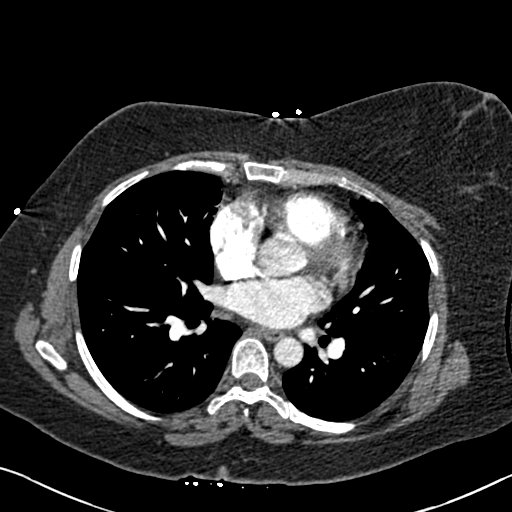
[im 198/340  lung]
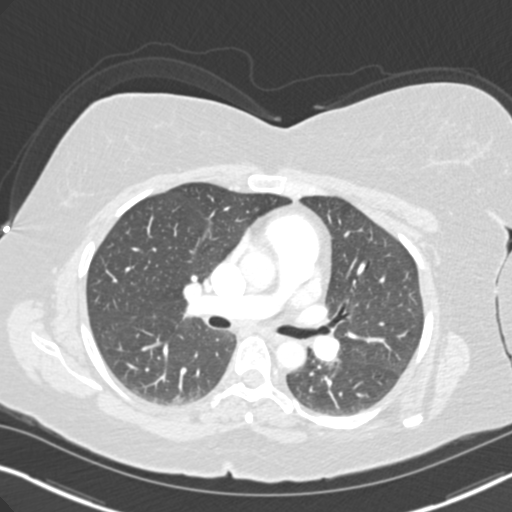
[im 212/340  soft-tissue]
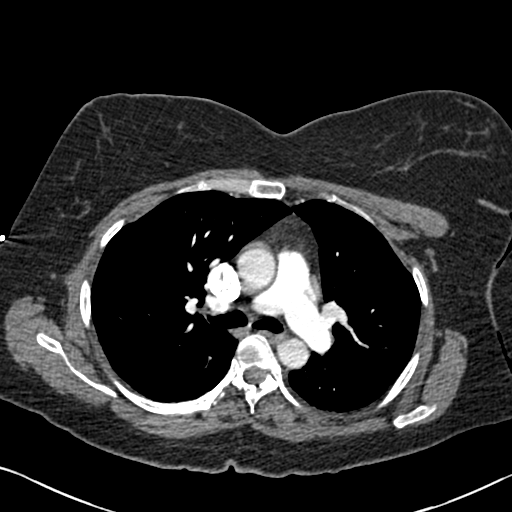
[im 241/340  lung]
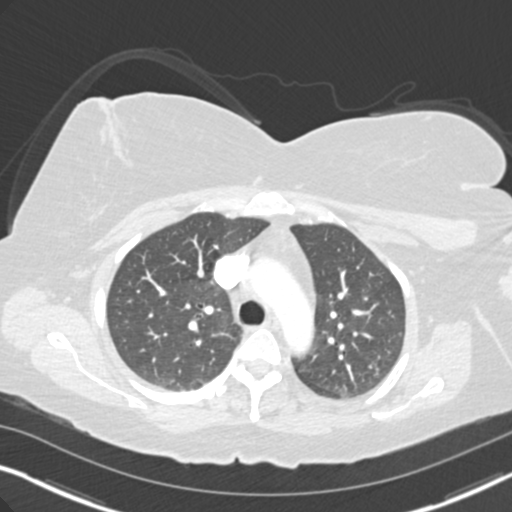
[im 255/340  soft-tissue]
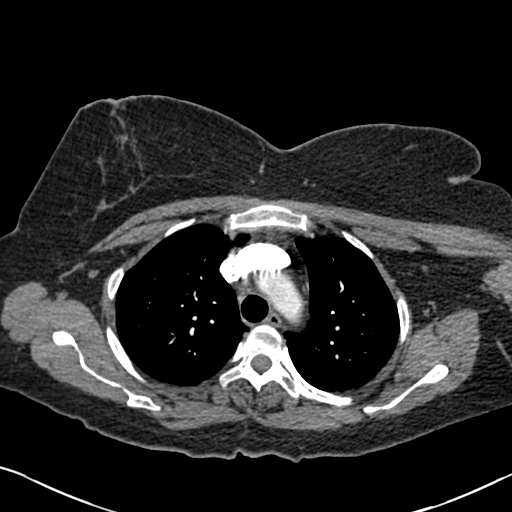
[im 283/340  lung]
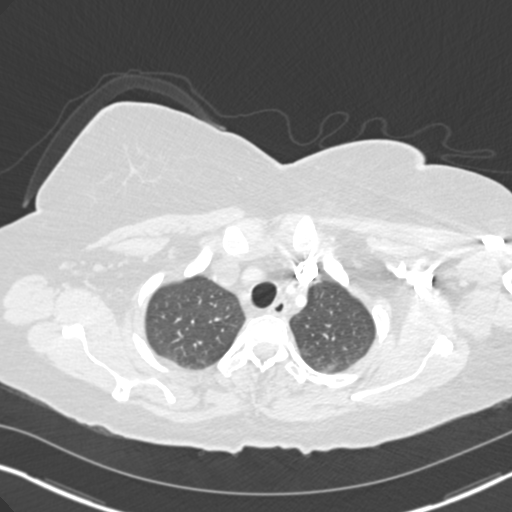
[im 297/340  soft-tissue]
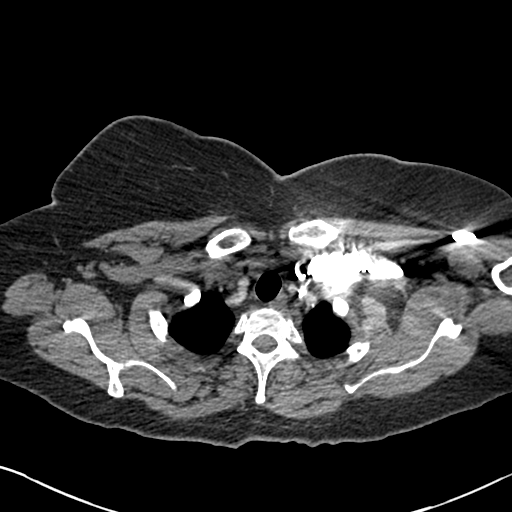
[im 325/340  lung]
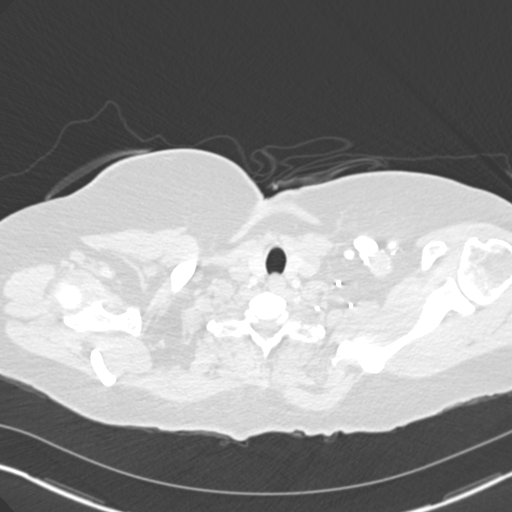

[Series 8: coronal mpr · coronal · 0.54mm/px · 3 of 106 slices shown]
[im 27/106  soft-tissue]
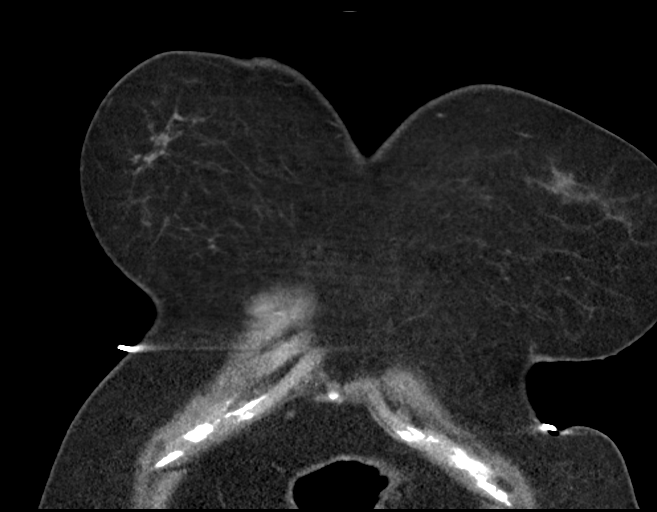
[im 53/106  soft-tissue]
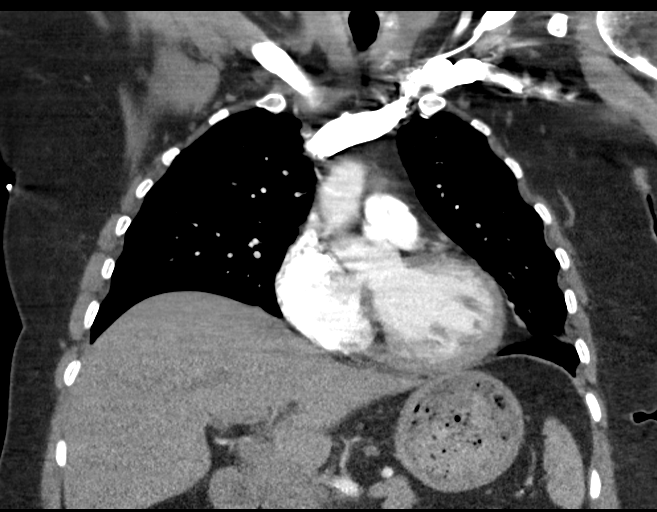
[im 79/106  soft-tissue]
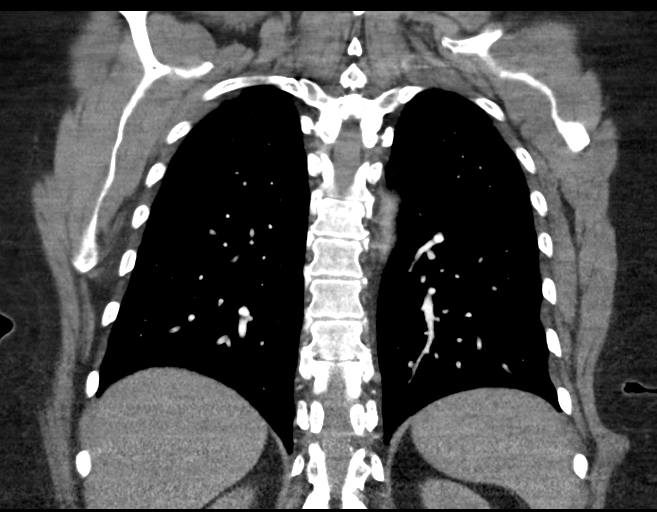

[18 of 46 positions shown; findings below may reference images not displayed]

FINDINGS: Cardiovascular: Satisfactory opacification of the pulmonary arteries
to the segmental level. No evidence of pulmonary embolism. The
thoracic aorta is normal in caliber. Normal heart size. No
pericardial effusion.

Mediastinum/Nodes: No enlarged mediastinal, hilar, or axillary lymph
nodes. Calcified left thyroid nodule.

Lungs/Pleura: No pleural effusion. No pneumothorax. No mass or focal
consolidation. No suspicious pulmonary nodules.

Musculoskeletal: No aggressive osseous lesions.

Upper abdomen: The visualized upper abdomen is unremarkable.

Review of the MIP images confirms the above findings.
IMPRESSION: 1. No pulmonary embolism.
2. Calcified left thyroid nodule. Recommend thyroid ultrasound for
further evaluation

## 2022-12-02 ENCOUNTER — Other Ambulatory Visit: Payer: Self-pay | Admitting: Gastroenterology

## 2022-12-02 DIAGNOSIS — R7989 Other specified abnormal findings of blood chemistry: Secondary | ICD-10-CM

## 2022-12-02 DIAGNOSIS — R748 Abnormal levels of other serum enzymes: Secondary | ICD-10-CM

## 2022-12-27 ENCOUNTER — Other Ambulatory Visit: Payer: Self-pay | Admitting: Radiology

## 2022-12-27 DIAGNOSIS — Z01812 Encounter for preprocedural laboratory examination: Secondary | ICD-10-CM

## 2022-12-27 HISTORY — PX: LIVER BIOPSY: SHX301

## 2022-12-27 NOTE — Progress Notes (Signed)
Patient for US Liver Biopsy on Monday 12/30/2022, I called and spoke with the patient on the phone and gave pre-procedure instructions. Pt was made aware to be here at 7:30a, NPO after MN prior to procedure as well as driver post procedure/recovery/discharge. Pt stated understanding.  Called 12/27/2022

## 2022-12-30 ENCOUNTER — Ambulatory Visit
Admission: RE | Admit: 2022-12-30 | Discharge: 2022-12-30 | Disposition: A | Discharge status: Home / Self Care | Payer: Medicare Other | Source: Ambulatory Visit | Attending: Gastroenterology | Admitting: Gastroenterology

## 2022-12-30 ENCOUNTER — Other Ambulatory Visit: Payer: Self-pay

## 2022-12-30 DIAGNOSIS — F419 Anxiety disorder, unspecified: Secondary | ICD-10-CM | POA: Diagnosis not present

## 2022-12-30 DIAGNOSIS — K739 Chronic hepatitis, unspecified: Secondary | ICD-10-CM | POA: Insufficient documentation

## 2022-12-30 DIAGNOSIS — F17210 Nicotine dependence, cigarettes, uncomplicated: Secondary | ICD-10-CM | POA: Diagnosis not present

## 2022-12-30 DIAGNOSIS — K7400 Hepatic fibrosis, unspecified: Secondary | ICD-10-CM | POA: Insufficient documentation

## 2022-12-30 DIAGNOSIS — Z01812 Encounter for preprocedural laboratory examination: Secondary | ICD-10-CM

## 2022-12-30 DIAGNOSIS — F32A Depression, unspecified: Secondary | ICD-10-CM | POA: Diagnosis not present

## 2022-12-30 DIAGNOSIS — R7989 Other specified abnormal findings of blood chemistry: Secondary | ICD-10-CM

## 2022-12-30 DIAGNOSIS — R748 Abnormal levels of other serum enzymes: Secondary | ICD-10-CM | POA: Diagnosis present

## 2022-12-30 LAB — CBC
HCT: 42.1 % (ref 36.0–46.0)
Hemoglobin: 13.7 g/dL (ref 12.0–15.0)
MCH: 30.3 pg (ref 26.0–34.0)
MCHC: 32.5 g/dL (ref 30.0–36.0)
MCV: 93.1 fL (ref 80.0–100.0)
Platelets: 133 10*3/uL — ABNORMAL LOW (ref 150–400)
RBC: 4.52 MIL/uL (ref 3.87–5.11)
RDW: 13.2 % (ref 11.5–15.5)
WBC: 7.4 10*3/uL (ref 4.0–10.5)
nRBC: 0 % (ref 0.0–0.2)

## 2022-12-30 LAB — PROTIME-INR
INR: 0.9 (ref 0.8–1.2)
Prothrombin Time: 12.8 s (ref 11.4–15.2)

## 2022-12-30 MED ORDER — NALOXONE HCL 2 MG/2ML IJ SOSY
PREFILLED_SYRINGE | INTRAMUSCULAR | Status: AC
  Filled 2022-12-30: qty 2

## 2022-12-30 MED ORDER — MIDAZOLAM HCL 2 MG/2ML IJ SOLN
INTRAMUSCULAR | Status: AC
  Filled 2022-12-30: qty 4

## 2022-12-30 MED ORDER — LIDOCAINE HCL (PF) 1 % IJ SOLN
10.0000 mL | Freq: Once | INTRAMUSCULAR | Status: AC
Start: 2022-12-30 — End: 2022-12-30
  Administered 2022-12-30: 10 mL via SUBCUTANEOUS
  Filled 2022-12-30: qty 10

## 2022-12-30 MED ORDER — MIDAZOLAM HCL 2 MG/2ML IJ SOLN
INTRAMUSCULAR | Status: AC | PRN
  Administered 2022-12-30 (×2): 1 mg via INTRAVENOUS

## 2022-12-30 MED ORDER — FLUMAZENIL 0.5 MG/5ML IV SOLN
INTRAVENOUS | Status: AC
  Filled 2022-12-30: qty 5

## 2022-12-30 MED ORDER — FENTANYL CITRATE (PF) 100 MCG/2ML IJ SOLN
INTRAMUSCULAR | Status: AC
  Filled 2022-12-30: qty 2

## 2022-12-30 MED ORDER — FENTANYL CITRATE (PF) 100 MCG/2ML IJ SOLN
INTRAMUSCULAR | Status: AC | PRN
  Administered 2022-12-30: 50 ug via INTRAVENOUS
  Administered 2022-12-30: 25 ug via INTRAVENOUS

## 2022-12-30 NOTE — H&P (Signed)
Chief Complaint: Patient was seen in consultation today for elevated liver enzymes  Referring Physician(s): Ardelia Mems  Supervising Physician: Malachy Moan  Patient Status: [facility] - Out-pt  History of Present Illness: Michele Mann is a 47 y.o. female with PMH significant for anxiety and depression being seen today in relation to elevated liver enzymes. Patient has been seen by Dr Timothy Lasso from [facility] GI team for the above. There is concern for possible primary biliary cholangitis. Patient has been referred to IR for image-guided liver biopsy.  Past Medical History:  Diagnosis Date   Anxiety attack    Depression     Past Surgical History:  Procedure Laterality Date   ABDOMINAL HYSTERECTOMY  03/13/2012   CESAREAN SECTION     x2    Allergies: Patient has no known allergies.  Medications: Prior to Admission medications   Medication Sig Start Date End Date Taking? Authorizing Provider  acetaminophen (TYLENOL) 500 MG tablet Take 1,000 mg by mouth 3 (three) times daily as needed for pain.    [provider]  acetaZOLAMIDE (DIAMOX) 250 MG tablet Take 250 mg by mouth 2 (two) times daily.    [provider]  ALPRAZolam Prudy Feeler) 1 MG tablet Take 1 mg by mouth 3 (three) times daily as needed for sleep or anxiety.    [provider]  FLUoxetine (PROZAC) 40 MG capsule Take 40 mg by mouth daily. Patient not taking: Reported on 03/06/2020    [provider]  HYDROcodone-acetaminophen (NORCO/VICODIN) 5-325 MG per tablet Take 1 tablet by mouth every 4 (four) hours as needed for pain. 08/31/12   Muthersbaugh, Dahlia Client, PA-C  ibuprofen (ADVIL,MOTRIN) 200 MG tablet Take 600 mg by mouth once.    [provider]  ibuprofen (ADVIL,MOTRIN) 800 MG tablet Take 1 tablet (800 mg total) by mouth 3 (three) times daily. Patient not taking: Reported on 03/06/2020 08/31/12   Muthersbaugh, Dahlia Client, PA-C  ketorolac (TORADOL) 10 MG tablet Take 1  tablet (10 mg total) by mouth every 6 (six) hours as needed. Patient not taking: Reported on 03/06/2020 07/21/14   Garrel Ridgel, PA-C  sulfamethoxazole-trimethoprim (BACTRIM DS,SEPTRA DS) 800-160 MG per tablet Take 1 tablet by mouth 2 (two) times daily. Patient not taking: Reported on 03/06/2020 07/21/14   Garrel Ridgel, PA-C  venlafaxine (EFFEXOR) 75 MG tablet Take 75 mg by mouth once.    [provider]     No family history on file.  Social History   Socioeconomic History   Marital status: Married    Spouse name: Not on file   Number of children: Not on file   Years of education: Not on file   Highest education level: Not on file  Occupational History   Not on file  Tobacco Use   Smoking status: Every Day    Current packs/day: 1.00    Types: Cigarettes   Smokeless tobacco: Never  Substance and Sexual Activity   Alcohol use: Yes   Drug use: No   Sexual activity: Yes    Birth control/protection: None  Other Topics Concern   Not on file  Social History Narrative   Not on file   Social Determinants of Health   Financial Resource Strain: Not on file  Food Insecurity: Not on file  Transportation Needs: Not on file  Physical Activity: Not on file  Stress: Not on file  Social Connections: Not on file    Code Status: Full code  Review of Systems: A 12  point ROS discussed and pertinent positives are indicated in the HPI above.  All other systems are negative.  Review of Systems  Constitutional:  Negative for chills and fever.  Respiratory:  Negative for chest tightness and shortness of breath.   Cardiovascular:  Negative for chest pain and leg swelling.  Gastrointestinal:  Positive for nausea. Negative for abdominal pain, diarrhea and vomiting.  Neurological:  Positive for headaches. Negative for dizziness.  Psychiatric/Behavioral:  Negative for confusion.     Vital Signs: BP 100/72   Pulse 78   Temp 97.7 F (36.5 C) (Oral)   Resp 12    Ht 5' 3.5" (1.613 m)   Wt 174 lb (78.9 kg)   LMP 01/12/2012   SpO2 100%   BMI 30.34 kg/m     Physical Exam Vitals reviewed.  Constitutional:      General: She is not in acute distress.    Appearance: She is not ill-appearing.  Cardiovascular:     Rate and Rhythm: Normal rate and regular rhythm.     Pulses: Normal pulses.     Heart sounds: Normal heart sounds.  Pulmonary:     Effort: Pulmonary effort is normal.     Breath sounds: Normal breath sounds.  Abdominal:     Palpations: Abdomen is soft.     Tenderness: There is no abdominal tenderness.  Musculoskeletal:     Right lower leg: No edema.     Left lower leg: No edema.  Skin:    General: Skin is warm and dry.  Neurological:     Mental Status: She is alert and oriented to person, place, and time.  Psychiatric:        Mood and Affect: Mood normal.        Behavior: Behavior normal.        Thought Content: Thought content normal.        Judgment: Judgment normal.     Imaging: No results found.  Labs:  CBC: No results for input(s): "WBC", "HGB", "HCT", "PLT" in the last 8760 hours.  COAGS: No results for input(s): "INR", "APTT" in the last 8760 hours.  BMP: No results for input(s): "NA", "K", "CL", "CO2", "GLUCOSE", "BUN", "CALCIUM", "CREATININE", "GFRNONAA", "GFRAA" in the last 8760 hours.  Invalid input(s): "CMP"  LIVER FUNCTION TESTS: No results for input(s): "BILITOT", "AST", "ALT", "ALKPHOS", "PROT", "ALBUMIN" in the last 8760 hours.  TUMOR MARKERS: No results for input(s): "AFPTM", "CEA", "CA199", "CHROMGRNA" in the last 8760 hours.  Assessment and Plan:  Michele Mann is a 47 yo female being seen today in relation to elevated liver enzymes with concern for possible primary biliary cholangitis. Patient is under the care of Dr Timothy Lasso from [facility]. Case reviewed with Dr Archer Asa this AM. Pre-procedural labs are pending. Patient is NPO and presents today in her usual state of  health.  Risks and benefits of image-guided liver biopsy was discussed with the patient and/or patient's family including, but not limited to bleeding, infection, damage to adjacent structures or low yield requiring additional tests.  All of the questions were answered and there is agreement to proceed.  Consent signed and in chart.   Thank you for this interesting consult.  I greatly enjoyed meeting Michele Mann and look forward to participating in their care.  A copy of this report was sent to the requesting provider on this date.  Electronically Signed: Kennieth Francois, PA-C 12/30/2022, 7:38 AM   I spent a total of  15 Minutes  in face to face in clinical consultation, greater than 50% of which was counseling/coordinating care for elevated liver enzymes.

## 2022-12-30 NOTE — Procedures (Signed)
Interventional Radiology Procedure Note  Procedure: US guided random liver biopsy  Complications: None immediate  Estimated Blood Loss: None  Recommendations: - Bedrest x 2 hrs - DC home    Signed,  Sterling Big, MD

## 2023-01-03 LAB — SURGICAL PATHOLOGY

## 2023-01-16 ENCOUNTER — Encounter: Payer: Self-pay | Admitting: Internal Medicine

## 2023-02-05 ENCOUNTER — Ambulatory Visit
Admission: RE | Admit: 2023-02-05 | Discharge: 2023-02-05 | Disposition: A | Discharge status: Home / Self Care | Payer: Medicare Other | Attending: Internal Medicine | Admitting: Internal Medicine

## 2023-02-05 ENCOUNTER — Ambulatory Visit: Payer: Medicare Other | Admitting: Certified Registered"

## 2023-02-05 ENCOUNTER — Encounter
Admission: RE | Disposition: A | Discharge status: Home / Self Care | Payer: Self-pay | Source: Home / Self Care | Attending: Internal Medicine

## 2023-02-05 ENCOUNTER — Encounter: Payer: Self-pay | Admitting: Internal Medicine

## 2023-02-05 DIAGNOSIS — K2970 Gastritis, unspecified, without bleeding: Secondary | ICD-10-CM | POA: Insufficient documentation

## 2023-02-05 DIAGNOSIS — D125 Benign neoplasm of sigmoid colon: Secondary | ICD-10-CM | POA: Insufficient documentation

## 2023-02-05 DIAGNOSIS — F17210 Nicotine dependence, cigarettes, uncomplicated: Secondary | ICD-10-CM | POA: Diagnosis not present

## 2023-02-05 DIAGNOSIS — Z1211 Encounter for screening for malignant neoplasm of colon: Secondary | ICD-10-CM | POA: Insufficient documentation

## 2023-02-05 DIAGNOSIS — K640 First degree hemorrhoids: Secondary | ICD-10-CM | POA: Diagnosis not present

## 2023-02-05 DIAGNOSIS — R12 Heartburn: Secondary | ICD-10-CM | POA: Insufficient documentation

## 2023-02-05 HISTORY — PX: ESOPHAGOGASTRODUODENOSCOPY (EGD) WITH PROPOFOL: SHX5813

## 2023-02-05 HISTORY — DX: Benign intracranial hypertension: G93.2

## 2023-02-05 HISTORY — PX: BIOPSY: SHX5522

## 2023-02-05 HISTORY — DX: Cardiac murmur, unspecified: R01.1

## 2023-02-05 HISTORY — DX: Unspecified osteoarthritis, unspecified site: M19.90

## 2023-02-05 HISTORY — PX: COLONOSCOPY WITH PROPOFOL: SHX5780

## 2023-02-05 HISTORY — PX: POLYPECTOMY: SHX5525

## 2023-02-05 SURGERY — COLONOSCOPY WITH PROPOFOL
Anesthesia: General

## 2023-02-05 MED ORDER — SODIUM CHLORIDE 0.9 % IV SOLN: INTRAVENOUS | Status: DC

## 2023-02-05 MED ORDER — BUTAMBEN-TETRACAINE-BENZOCAINE 2-2-14 % EX AERO
INHALATION_SPRAY | CUTANEOUS | Status: AC
  Filled 2023-02-05: qty 5

## 2023-02-05 MED ORDER — LIDOCAINE HCL (PF) 2 % IJ SOLN
INTRAMUSCULAR | Status: DC | PRN
  Administered 2023-02-05: 40 mg via INTRADERMAL

## 2023-02-05 MED ORDER — BUTAMBEN-TETRACAINE-BENZOCAINE 2-2-14 % EX AERO
INHALATION_SPRAY | CUTANEOUS | Status: DC | PRN
  Administered 2023-02-05: 1 via TOPICAL

## 2023-02-05 MED ORDER — PROPOFOL 10 MG/ML IV BOLUS
INTRAVENOUS | Status: AC
  Filled 2023-02-05: qty 20

## 2023-02-05 MED ORDER — PROPOFOL 10 MG/ML IV BOLUS
INTRAVENOUS | Status: DC | PRN
  Administered 2023-02-05 (×3): 20 mg via INTRAVENOUS
  Administered 2023-02-05: 40 mg via INTRAVENOUS
  Administered 2023-02-05: 20 mg via INTRAVENOUS
  Administered 2023-02-05: 50 mg via INTRAVENOUS
  Administered 2023-02-05: 30 mg via INTRAVENOUS
  Administered 2023-02-05 (×2): 20 mg via INTRAVENOUS

## 2023-02-05 NOTE — Anesthesia Preprocedure Evaluation (Signed)
Anesthesia Evaluation  Patient identified by MRN, date of birth, ID band Patient awake    Reviewed: Allergy & Precautions, H&P , NPO status , Patient's Chart, lab work & pertinent test results, reviewed documented beta blocker date and time   Airway Mallampati: II   Neck ROM: full    Dental  (+) Poor Dentition   Pulmonary neg pulmonary ROS, Current Smoker   Pulmonary exam normal        Cardiovascular Exercise Tolerance: Poor Normal cardiovascular exam+ Valvular Problems/Murmurs  Rhythm:regular Rate:Normal     Neuro/Psych   Anxiety Depression    negative neurological ROS  negative psych ROS   GI/Hepatic negative GI ROS, Neg liver ROS,,,  Endo/Other  negative endocrine ROS    Renal/GU negative Renal ROS  negative genitourinary   Musculoskeletal   Abdominal   Peds  Hematology negative hematology ROS (+)   Anesthesia Other Findings Past Medical History: No date: Anxiety attack No date: Arthritis No date: Depression No date: Heart murmur No date: Idiopathic intracranial hypertension Past Surgical History: 03/13/2012: ABDOMINAL HYSTERECTOMY No date: CESAREAN SECTION     Comment:  x2 12/27/2022: LIVER BIOPSY No date: OOPHORECTOMY BMI    Body Mass Index: 29.65 kg/m     Reproductive/Obstetrics negative OB ROS                             Anesthesia Physical Anesthesia Plan  ASA: 3  Anesthesia Plan: General   Post-op Pain Management:    Induction:   PONV Risk Score and Plan:   Airway Management Planned:   Additional Equipment:   Intra-op Plan:   Post-operative Plan:   Informed Consent: I have reviewed the patients History and Physical, chart, labs and discussed the procedure including the risks, benefits and alternatives for the proposed anesthesia with the patient or authorized representative who has indicated his/her understanding and acceptance.     Dental Advisory  Given  Plan Discussed with: CRNA  Anesthesia Plan Comments:        Anesthesia Quick Evaluation

## 2023-02-05 NOTE — Op Note (Signed)
[facility] Gastroenterology Patient Name: Michele Mann Procedure Date: 02/05/2023 11:41 AM MRN: [id number] Account #: [id number] Date of Birth: 1976-01-03 Admit Type: Outpatient Age: 47 Room: [facility] ENDO ROOM 2 Gender: Female Note Status: Finalized Instrument Name: Upper Endoscope [id number] Procedure:             Upper GI endoscopy Indications:           Epigastric abdominal pain, Heartburn Providers:             Boykin Nearing. Tarus Briski MD, MD Medicines:             Propofol per Anesthesia Complications:         No immediate complications. Estimated blood loss:                         Minimal. Procedure:             Pre-Anesthesia Assessment:                        - The risks and benefits of the procedure and the                         sedation options and risks were discussed with the                         patient. All questions were answered and informed                         consent was obtained.                        - Patient identification and proposed procedure were                         verified prior to the procedure by the nurse. The                         procedure was verified in the procedure room.                        - ASA Grade Assessment: III - A patient with severe                         systemic disease.                        - After reviewing the risks and benefits, the patient                         was deemed in satisfactory condition to undergo the                         procedure.                        After obtaining informed consent, the endoscope was                         passed under direct vision. Throughout the procedure,  the patient's blood pressure, pulse, and oxygen                         saturations were monitored continuously. The Endoscope                         was introduced through the mouth, and advanced to the                         third part of duodenum. The upper GI endoscopy was                          accomplished without difficulty. The patient tolerated                         the procedure well. Findings:      The esophagus was normal.      Patchy minimal inflammation characterized by erythema was found in the       gastric body. Biopsies were taken with a cold forceps for Helicobacter       pylori testing.      The exam of the stomach was otherwise normal.      The examined duodenum was normal. Impression:            - Normal esophagus.                        - Gastritis. Biopsied.                        - Normal examined duodenum. Recommendation:        - Await pathology results.                        - Proceed with colonoscopy Procedure Code(s):     --- Professional ---                        [phone], Esophagogastroduodenoscopy, flexible,                         transoral; with biopsy, single or multiple Diagnosis Code(s):     --- Professional ---                        R12, Heartburn                        R10.13, Epigastric pain                        K29.70, Gastritis, unspecified, without bleeding CPT copyright 2022 American Medical Association. All rights reserved. The codes documented in this report are preliminary and upon coder review may  be revised to meet current compliance requirements. Stanton Kidney MD, MD 02/05/2023 11:58:28 AM This report has been signed electronically. Number of Addenda: 0 Note Initiated On: 02/05/2023 11:41 AM Estimated Blood Loss:  Estimated blood loss was minimal.      [facility]

## 2023-02-05 NOTE — H&P (Signed)
Outpatient short stay form Pre-procedure 02/05/2023 11:36 AM Michele Mann, M.D.  Primary Physician: Janice Coffin  Reason for visit:  Epigastric pain, GERD, colon cancer screening  History of present illness:  Michele Mann reports having issues with intermittent epigastric pain, she reports it can be described as a burning discomfort, it can occur before or after eating and be associated with nausea and that is better or worse after meal. No specific food triggers. She feels bloated at times. She has intermittent heartburn symptoms and feels Tums helps symptoms but does not feel that she needs frequently. She denies any vomiting. She does endorse bloating.  She denies any issues with constipation, diarrhea, melena, rectal bleeding, blood in stool. Does feel her stools can vary in consistency but no severe bowel changes. She denies any lower abdominal pain.  She was seen in the emergency room in June 2024 for several days of right back pain, this ultimately resolved and has returned occasionally but not lasted as long as it did in June 2024.  She smokes 1 pack/day. She denies alcohol use and feels she has never used alcohol heavily. She denies NSAID use more than 1-2 times a month.  Denies any family hx colon polyps or colon cancer.     Current Facility-Administered Medications:    0.9 %  sodium chloride infusion, , Intravenous, Continuous, [place], Boykin Nearing, MD, Last Rate: 20 mL/hr at 02/05/23 1105, New Bag at 02/05/23 1105  Medications Prior to Admission  Medication Sig Dispense Refill Last Dose   albuterol (VENTOLIN HFA) 108 (90 Base) MCG/ACT inhaler Inhale 2 puffs into the lungs every 6 (six) hours as needed for wheezing or shortness of breath.      Fluticasone-Umeclidin-Vilant (TRELEGY ELLIPTA) 100-62.5-25 MCG/ACT AEPB Inhale 1 puff into the lungs daily.      methocarbamol (ROBAXIN) 750 MG tablet Take 750 mg by mouth 4 (four) times daily.      pantoprazole (PROTONIX) 40 MG tablet  Take 40 mg by mouth daily.      ursodiol (ACTIGALL) 500 MG tablet Take 500 mg by mouth 3 (three) times daily.      Vitamin D, Ergocalciferol, (DRISDOL) 1.25 MG (50000 UNIT) CAPS capsule Take 50,000 Units by mouth every 7 (seven) days.      acetaminophen (TYLENOL) 500 MG tablet Take 1,000 mg by mouth 3 (three) times daily as needed for pain.      acetaZOLAMIDE (DIAMOX) 250 MG tablet Take 250 mg by mouth 2 (two) times daily.      ALPRAZolam (XANAX) 1 MG tablet Take 1 mg by mouth 3 (three) times daily as needed for sleep or anxiety. (Patient not taking: Reported on 12/30/2022)      FLUoxetine (PROZAC) 40 MG capsule Take 40 mg by mouth daily. (Patient not taking: Reported on 03/06/2020)      furosemide (LASIX) 20 MG tablet Take 20 mg by mouth. Prn      HYDROcodone-acetaminophen (NORCO/VICODIN) 5-325 MG per tablet Take 1 tablet by mouth every 4 (four) hours as needed for pain. (Patient not taking: Reported on 12/30/2022) 7 tablet 0    ibuprofen (ADVIL,MOTRIN) 200 MG tablet Take 600 mg by mouth once.      ibuprofen (ADVIL,MOTRIN) 800 MG tablet Take 1 tablet (800 mg total) by mouth 3 (three) times daily. 21 tablet 0    ketorolac (TORADOL) 10 MG tablet Take 1 tablet (10 mg total) by mouth every 6 (six) hours as needed. (Patient not taking: Reported on 03/06/2020) 20 tablet 0  sulfamethoxazole-trimethoprim (BACTRIM DS,SEPTRA DS) 800-160 MG per tablet Take 1 tablet by mouth 2 (two) times daily. (Patient not taking: Reported on 03/06/2020) 14 tablet 0    venlafaxine (EFFEXOR) 75 MG tablet Take 75 mg by mouth once. (Patient not taking: Reported on 12/30/2022)        No Known Allergies   Past Medical History:  Diagnosis Date   Anxiety attack    Arthritis    Depression    Heart murmur    Idiopathic intracranial hypertension     Review of systems:  Otherwise negative.    Physical Exam  Gen: Alert, oriented. Appears stated age.  HEENT: [place]/AT. PERRLA. Lungs: CTA, no wheezes. CV: RR nl S1, S2. Abd:  soft, benign, no masses. BS+ Ext: No edema. Pulses 2+    Planned procedures: Proceed with EGD and colonoscopy. The patient understands the nature of the planned procedure, indications, risks, alternatives and potential complications including but not limited to bleeding, infection, perforation, damage to internal organs and possible oversedation/side effects from anesthesia. The patient agrees and gives consent to proceed.  Please refer to procedure notes for findings, recommendations and patient disposition/instructions.     Decorey Wahlert K. Norma Mann, M.D. Gastroenterology 02/05/2023  11:36 AM

## 2023-02-05 NOTE — Op Note (Signed)
[facility] Gastroenterology Patient Name: Michele Mann Procedure Date: 02/05/2023 11:41 AM MRN: [id number] Account #: [id number] Date of Birth: 14-Dec-1975 Admit Type: Outpatient Age: 47 Room: [facility] ENDO ROOM 2 Gender: Female Note Status: Finalized Instrument Name: Prentice Docker [id number] Procedure:             Colonoscopy Indications:           Screening for colorectal malignant neoplasm Providers:             Royce Macadamia K. Archer Vise MD, MD Medicines:             Propofol per Anesthesia Complications:         No immediate complications. Estimated blood loss:                         Minimal. Procedure:             Pre-Anesthesia Assessment:                        - The risks and benefits of the procedure and the                         sedation options and risks were discussed with the                         patient. All questions were answered and informed                         consent was obtained.                        - Patient identification and proposed procedure were                         verified prior to the procedure by the nurse. The                         procedure was verified in the procedure room.                        - ASA Grade Assessment: III - A patient with severe                         systemic disease.                        - After reviewing the risks and benefits, the patient                         was deemed in satisfactory condition to undergo the                         procedure.                        After obtaining informed consent, the colonoscope was                         passed under direct vision. Throughout the procedure,  the patient's blood pressure, pulse, and oxygen                         saturations were monitored continuously. The                         Colonoscope was introduced through the anus and                         advanced to the the cecum, identified by appendiceal                          orifice and ileocecal valve. The colonoscopy was                         performed without difficulty. The patient tolerated                         the procedure well. The quality of the bowel                         preparation was good. The ileocecal valve, appendiceal                         orifice, and rectum were photographed. Findings:      The perianal and digital rectal examinations were normal. Pertinent       negatives include normal sphincter tone and no palpable rectal lesions.      Non-bleeding internal hemorrhoids were found during retroflexion. The       hemorrhoids were Grade I (internal hemorrhoids that do not prolapse).      A 3 mm polyp was found in the sigmoid colon. The polyp was sessile. The       polyp was removed with a cold biopsy forceps. Resection and retrieval       were complete.      The exam was otherwise without abnormality. Impression:            - Non-bleeding internal hemorrhoids.                        - One 3 mm polyp in the sigmoid colon, removed with a                         cold biopsy forceps. Resected and retrieved.                        - The examination was otherwise normal. Recommendation:        - Await pathology results from EGD, also performed                         today.                        - Patient has a contact number available for                         emergencies. The signs and symptoms of potential  delayed complications were discussed with the patient.                         Return to normal activities tomorrow. Written                         discharge instructions were provided to the patient.                        - Resume previous diet.                        - Continue present medications.                        - Repeat colonoscopy is recommended for surveillance.                         The colonoscopy date will be determined after                         pathology results from  today's exam become available                         for review.                        - Follow up with Tawni Pummel, PA-C at [facility]. [phone].                        - Telephone GI office to schedule appointment in 3                         months. Procedure Code(s):     --- Professional ---                        [phone], Colonoscopy, flexible; with biopsy, single or                         multiple Diagnosis Code(s):     --- Professional ---                        K64.0, First degree hemorrhoids                        D12.5, Benign neoplasm of sigmoid colon                        Z12.11, Encounter for screening for malignant neoplasm                         of colon CPT copyright 2022 American Medical Association. All rights reserved. The codes documented in this report are preliminary and upon coder review may  be revised to meet current compliance requirements. Stanton Kidney MD, MD 02/05/2023 12:16:13 PM This report has been signed electronically. Number of Addenda: 0 Note Initiated On: 02/05/2023 11:41 AM Scope Withdrawal Time:  0 hours 5 minutes 24 seconds  Total Procedure Duration: 0 hours 9 minutes 13 seconds  Estimated Blood Loss:  Estimated blood loss was minimal.      [facility]

## 2023-02-05 NOTE — Transfer of Care (Signed)
Immediate Anesthesia Transfer of Care Note  Patient: Michele Mann  Procedure(s) Performed: COLONOSCOPY WITH PROPOFOL ESOPHAGOGASTRODUODENOSCOPY (EGD) WITH PROPOFOL BIOPSY POLYPECTOMY  Patient Location: PACU and Endoscopy Unit  Anesthesia Type:MAC  Level of Consciousness: awake and alert   Airway & Oxygen Therapy: Patient Spontanous Breathing and Patient connected to nasal cannula oxygen  Post-op Assessment: Report given to RN and Post -op Vital signs reviewed and stable  Post vital signs: Reviewed and stable  Last Vitals:  Vitals Value Taken Time  BP 124/68   Temp    Pulse 70   Resp 17 02/05/23 1212  SpO2 98   Vitals shown include unfiled device data.  Last Pain:  Vitals:   02/05/23 1102  TempSrc: Temporal  PainSc: 0-No pain         Complications: No notable events documented.

## 2023-02-05 NOTE — Interval H&P Note (Signed)
History and Physical Interval Note:  02/05/2023 11:38 AM  Michele Mann  has presented today for surgery, with the diagnosis of R79.89  - Elevated LFTs Z12.11 - Colon cancer screening R10.13 - Epigastric pain.  The various methods of treatment have been discussed with the patient and family. After consideration of risks, benefits and other options for treatment, the patient has consented to  Procedure(s): COLONOSCOPY WITH PROPOFOL (N/A) ESOPHAGOGASTRODUODENOSCOPY (EGD) WITH PROPOFOL (N/A) as a surgical intervention.  The patient's history has been reviewed, patient examined, no change in status, stable for surgery.  I have reviewed the patient's chart and labs.  Questions were answered to the patient's satisfaction.     [place], [place]

## 2023-02-06 ENCOUNTER — Encounter: Payer: Self-pay | Admitting: Internal Medicine

## 2023-02-06 LAB — SURGICAL PATHOLOGY

## 2023-02-25 NOTE — Anesthesia Postprocedure Evaluation (Signed)
 Anesthesia Post Note  Patient: Michele Mann  Procedure(s) Performed: COLONOSCOPY WITH PROPOFOL  ESOPHAGOGASTRODUODENOSCOPY (EGD) WITH PROPOFOL  BIOPSY POLYPECTOMY  Patient location during evaluation: PACU Anesthesia Type: General Level of consciousness: awake and alert Pain management: pain level controlled Vital Signs Assessment: post-procedure vital signs reviewed and stable Respiratory status: spontaneous breathing, nonlabored ventilation, respiratory function stable and patient connected to nasal cannula oxygen Cardiovascular status: blood pressure returned to baseline and stable Postop Assessment: no apparent nausea or vomiting Anesthetic complications: no   No notable events documented.   Last Vitals:  Vitals:   02/05/23 1222 02/05/23 1232  BP: 92/60 (!) 93/53  Pulse: 75 68  Resp: 19   Temp:    SpO2: 100% 100%    Last Pain:  Vitals:   02/05/23 1232  TempSrc:   PainSc: 0-No pain                 [name]
